# Patient Record
Sex: Female | Born: 1995 | Race: White | Hispanic: No | Marital: Single | Smoking: Former smoker
Health system: Southern US, Community
[De-identification: ages and names within clinical notes are randomized; demographics above are authoritative.]

---

## 2017-07-07 ENCOUNTER — Encounter: Payer: Self-pay | Admitting: Emergency Medicine

## 2017-07-07 ENCOUNTER — Emergency Department
Admission: EM | Admit: 2017-07-07 | Discharge: 2017-07-07 | Disposition: A | Discharge status: Home / Self Care | Payer: Self-pay | Attending: Emergency Medicine | Admitting: Emergency Medicine

## 2017-07-07 ENCOUNTER — Emergency Department: Payer: Self-pay

## 2017-07-07 DIAGNOSIS — N61 Mastitis without abscess: Secondary | ICD-10-CM | POA: Insufficient documentation

## 2017-07-07 DIAGNOSIS — R52 Pain, unspecified: Secondary | ICD-10-CM

## 2017-07-07 DIAGNOSIS — N644 Mastodynia: Secondary | ICD-10-CM | POA: Insufficient documentation

## 2017-07-07 DIAGNOSIS — Z87891 Personal history of nicotine dependence: Secondary | ICD-10-CM | POA: Insufficient documentation

## 2017-07-07 LAB — BASIC METABOLIC PANEL
Anion gap: 9 (ref 5–15)
BUN: 12 mg/dL (ref 6–20)
CHLORIDE: 104 mmol/L (ref 101–111)
CO2: 23 mmol/L (ref 22–32)
Calcium: 8.8 mg/dL — ABNORMAL LOW (ref 8.9–10.3)
Creatinine, Ser: 0.79 mg/dL (ref 0.44–1.00)
GFR calc non Af Amer: >60 mL/min (ref >60)
Glucose, Bld: 90 mg/dL (ref 65–99)
POTASSIUM: 3.5 mmol/L (ref 3.5–5.1)
SODIUM: 136 mmol/L (ref 135–145)

## 2017-07-07 LAB — CBC
HEMATOCRIT: 36.8 % (ref 35.0–47.0)
HEMOGLOBIN: 12.3 g/dL (ref 12.0–16.0)
MCH: 26 pg (ref 26.0–34.0)
MCHC: 33.3 g/dL (ref 32.0–36.0)
MCV: 78 fL — AB (ref 80.0–100.0)
Platelets: 188 10*3/uL (ref 150–440)
RBC: 4.71 MIL/uL (ref 3.80–5.20)
RDW: 16.5 % — ABNORMAL HIGH (ref 11.5–14.5)
WBC: 9.7 10*3/uL (ref 3.6–11.0)

## 2017-07-07 LAB — PROTIME-INR
INR: 1.03
Prothrombin Time: 13.5 seconds (ref 11.4–15.2)

## 2017-07-07 LAB — HEPATIC FUNCTION PANEL
ALBUMIN: 3.7 g/dL (ref 3.5–5.0)
ALT: 10 U/L — ABNORMAL LOW (ref 14–54)
AST: 21 U/L (ref 15–41)
Alkaline Phosphatase: 86 U/L (ref 38–126)
Bilirubin, Direct: 0.2 mg/dL (ref 0.1–0.5)
Indirect Bilirubin: 1.3 mg/dL — ABNORMAL HIGH (ref 0.3–0.9)
Total Bilirubin: 1.5 mg/dL — ABNORMAL HIGH (ref 0.3–1.2)
Total Protein: 7.3 g/dL (ref 6.5–8.1)

## 2017-07-07 LAB — LACTIC ACID, PLASMA: Lactic Acid, Venous: 1.3 mmol/L (ref 0.5–1.9)

## 2017-07-07 LAB — HCG, QUANTITATIVE, PREGNANCY: HCG, BETA CHAIN, QUANT, S: 2 m[IU]/mL (ref <5)

## 2017-07-07 LAB — PROCALCITONIN

## 2017-07-07 MED ORDER — SULFAMETHOXAZOLE-TRIMETHOPRIM 800-160 MG PO TABS: 1.0000 | ORAL_TABLET | Freq: Two times a day (BID) | ORAL | 0 refills | Status: AC

## 2017-07-07 MED ORDER — CEPHALEXIN 500 MG PO CAPS: 500.0000 mg | ORAL_CAPSULE | Freq: Four times a day (QID) | ORAL | 0 refills | Status: AC

## 2017-07-07 MED ORDER — CEPHALEXIN 500 MG PO CAPS
500.0000 mg | ORAL_CAPSULE | Freq: Once | ORAL | Status: AC
  Administered 2017-07-07: 500 mg via ORAL
  Filled 2017-07-07: qty 1

## 2017-07-07 MED ORDER — SULFAMETHOXAZOLE-TRIMETHOPRIM 800-160 MG PO TABS
1.0000 | ORAL_TABLET | Freq: Once | ORAL | Status: AC
  Administered 2017-07-07: 1 via ORAL
  Filled 2017-07-07: qty 1

## 2017-07-07 NOTE — ED Provider Notes (Signed)
[facility] Emergency Department Provider Note  ____________________________________________   First MD Initiated Contact with Patient 07/07/17 1107     (approximate)  I have reviewed the triage vital signs and the nursing notes.   HISTORY  Chief Complaint Abscess and Dizziness   HPI Sara Jenkins is a 21 y.o. female ho comes to the emergency Department with roughly 1 month of painful swelling on the medial aspect of her left breast. She last gave birth roughly 6 weeks ago but is never breast fed this child. She last had sex 2 months ago and she is not currently taking any birth control. She said the pain is been progressive and most notably worse this morning which prompted the visit. She denies fevers or chills. The pain is worse when pressing in improved when not pressing. She has noted no discharge from her breasts.The pain is nonradiating. She has not lost any weight unintentionally or had any other B symptoms.   History reviewed. No pertinent past medical history.  There are no active problems to display for this patient.   History reviewed. No pertinent surgical history.  Prior to Admission medications   Medication Sig Start Date End Date Taking? Authorizing Provider  cephALEXin (KEFLEX) 500 MG capsule Take 1 capsule (500 mg total) by mouth 4 (four) times daily. 07/07/17 07/17/17  Merrily Brittle, MD  sulfamethoxazole-trimethoprim (BACTRIM DS,SEPTRA DS) 800-160 MG tablet Take 1 tablet by mouth 2 (two) times daily. 07/07/17 07/17/17  Merrily Brittle, MD    Allergies Patient has no known allergies.  History reviewed. No pertinent family history.  Social History Social History  Substance Use Topics  . Smoking status: Former Games developer  . Smokeless tobacco: Never Used  . Alcohol use Yes     Comment: Occ    Review of Systems Constitutional: No fever/chills Eyes: No visual changes. ENT: No sore throat. Cardiovascular: Denies chest  pain. Respiratory: Denies shortness of breath. Gastrointestinal: No abdominal pain.  No nausea, no vomiting.  No diarrhea.  No constipation. Genitourinary: Negative for dysuria. Musculoskeletal: Negative for back pain. Skin: Negative for rash. Neurological: Negative for headaches, focal weakness or numbness.   ____________________________________________   PHYSICAL EXAM:  VITAL SIGNS: ED Triage Vitals [07/07/17 1055]  Enc Vitals Group     BP 128/83     Pulse Rate (!) 112     Resp 18     Temp 99.7 F (37.6 C)     Temp Source Oral     SpO2 97 %     Weight 220 lb (99.8 kg)     Height 5\' 2"  (1.575 m)     Head Circumference      Peak Flow      Pain Score      Pain Loc      Pain Edu?      Excl. in GC?     Constitutional: alert and oriented 4 joking and laughing very pleasant cooperative speaks in full clear sentences Eyes: PERRL EOMI. Head: Atraumatic. Nose: No congestion/rhinnorhea. Mouth/Throat: No trismus Neck: No stridor.   Cardiovascular: tachycardicrate, regular rhythm. Grossly normal heart sounds.  Good peripheral circulation. Respiratory: Normal respiratory effort.  No retractions. Lungs CTAB and moving good air Gastrointestinal: oft nontender Breast exam: chaperoned by female nurse Sonja: left breast with roughly 6 cm area of erythema warmth and tenderness and underlying induration no fluctuance appreciated. No retractions of the skin. Breasts hang equally Neurologic:  Normal speech and language. No gross focal neurologic deficits are appreciated.  Skin:  Skin is warm, dry and intact. No rash noted. Psychiatric: Mood and affect are normal. Speech and behavior are normal.    ____________________________________________   DIFFERENTIAL includes but not limited to  reast abscess, breast cellulitis, breast cancer ____________________________________________   LABS (all labs ordered are listed, but only abnormal results are displayed)  Labs Reviewed  BLOOD  CULTURE ID PANEL (REFLEXED) - Abnormal; Notable for the following:       Result Value   Staphylococcus species DETECTED (*)    All other components within normal limits  BASIC METABOLIC PANEL - Abnormal; Notable for the following:    Calcium 8.8 (*)    All other components within normal limits  CBC - Abnormal; Notable for the following:    MCV 78.0 (*)    RDW 16.5 (*)    All other components within normal limits  HEPATIC FUNCTION PANEL - Abnormal; Notable for the following:    ALT 10 (*)    Total Bilirubin 1.5 (*)    Indirect Bilirubin 1.3 (*)    All other components within normal limits  CULTURE, BLOOD (ROUTINE X 2)  CULTURE, BLOOD (ROUTINE X 2)  LACTIC ACID, PLASMA  PROCALCITONIN  HCG, QUANTITATIVE, PREGNANCY  PROTIME-INR    Blood work unremarkable  EKG  ED ECG REPORT I, Merrily BrittleNeil Aydenn Gervin, the attending physician, personally viewed and interpreted this ECG.  Date: 07/07/2017 Rate: 112 Rhythm: normal sinus rhythm QRS Axis: normal Intervals: normal ST/T Wave abnormalities: normal Narrative Interpretation: unremarkable  ____________________________________________  RADIOLOGY  Ultrasound shows simple cellulitis no evidence of abscess ____________________________________________   PROCEDURES  Procedure(s) performed: no  Procedures  Critical Care performed: o  Observation: no ____________________________________________   INITIAL IMPRESSION / ASSESSMENT AND PLAN / ED COURSE  Pertinent labs & imaging results that were available during my care of the patient were reviewed by me and considered in my medical decision making (see chart for details).  The patient is afebrile butachycardic and uncomfortable appearing. The medial aspect of her left breast is erythematous and warm concerning either for breast abscess, mastitis, versus breast cancer. She is currently not septic and does not require antibiotics untilI have better diagnosis. Labs and ultrasound are  pending.     Fortunately the patient's ultrasound is negative for abscess and it appears to be a simple cellulitis/mastitis. She is stable for outpatient management with oral antibiotics and primary care follow-up. After the patient was discharged home her blood culture came back one bottle for coag negative staph which likely represents skin contamination as the patient was not septic and did not appear bacteremic. ____________________________________________   FINAL CLINICAL IMPRESSION(S) / ED DIAGNOSES  Final diagnoses:  Pain  Mastitis      NEW MEDICATIONS STARTED DURING THIS VISIT:  Discharge Medication List as of 07/07/2017  1:42 PM    START taking these medications   Details  cephALEXin (KEFLEX) 500 MG capsule Take 1 capsule (500 mg total) by mouth 4 (four) times daily., Starting Sat 07/07/2017, Until Tue 07/17/2017, Print    sulfamethoxazole-trimethoprim (BACTRIM DS,SEPTRA DS) 800-160 MG tablet Take 1 tablet by mouth 2 (two) times daily., Starting Sat 07/07/2017, Until Tue 07/17/2017, Print         Note:  This document was prepared using Dragon voice recognition software and may include unintentional dictation errors.     Merrily Brittleifenbark, Corda Shutt, MD 07/08/17 1425

## 2017-07-07 NOTE — Discharge Instructions (Signed)
Please take all of your antibiotics as prescribed and follow up with primary care in 7-10 days for symptoms have not completely resolved. Return to the emergency department sooner for any concerns.  It was a pleasure to take care of you today, and thank you for coming to our emergency department.  If you have any questions or concerns before leaving please ask the nurse to grab me and I'm more than happy to go through your aftercare instructions again.  If you were prescribed any opioid pain medication today such as Norco, Vicodin, Percocet, morphine, hydrocodone, or oxycodone please make sure you do not drive when you are taking this medication as it can alter your ability to drive safely.  If you have any concerns once you are home that you are not improving or are in fact getting worse before you can make it to your follow-up appointment, please do not hesitate to call 911 and come back for further evaluation.  Merrily BrittleNeil Anahli Arvanitis, MD  Results for orders placed or performed during the hospital encounter of 07/07/17  Basic metabolic panel  Result Value Ref Range   Sodium 136 135 - 145 mmol/L   Potassium 3.5 3.5 - 5.1 mmol/L   Chloride 104 101 - 111 mmol/L   CO2 23 22 - 32 mmol/L   Glucose, Bld 90 65 - 99 mg/dL   BUN 12 6 - 20 mg/dL   Creatinine, Ser 2.840.79 0.44 - 1.00 mg/dL   Calcium 8.8 (L) 8.9 - 10.3 mg/dL   GFR calc non Af Amer >60 >60 mL/min   GFR calc Af Amer >60 >60 mL/min   Anion gap 9 5 - 15  CBC  Result Value Ref Range   WBC 9.7 3.6 - 11.0 K/uL   RBC 4.71 3.80 - 5.20 MIL/uL   Hemoglobin 12.3 12.0 - 16.0 g/dL   HCT 13.236.8 44.035.0 - 10.247.0 %   MCV 78.0 (L) 80.0 - 100.0 fL   MCH 26.0 26.0 - 34.0 pg   MCHC 33.3 32.0 - 36.0 g/dL   RDW 72.516.5 (H) 36.611.5 - 44.014.5 %   Platelets 188 150 - 440 K/uL  Lactic acid, plasma  Result Value Ref Range   Lactic Acid, Venous 1.3 0.5 - 1.9 mmol/L  Procalcitonin  Result Value Ref Range   Procalcitonin <0.10 ng/mL  hCG, quantitative, pregnancy  Result Value  Ref Range   hCG, Beta Chain, Quant, S 2 <5 mIU/mL  Hepatic function panel  Result Value Ref Range   Total Protein 7.3 6.5 - 8.1 g/dL   Albumin 3.7 3.5 - 5.0 g/dL   AST 21 15 - 41 U/L   ALT 10 (L) 14 - 54 U/L   Alkaline Phosphatase 86 38 - 126 U/L   Total Bilirubin 1.5 (H) 0.3 - 1.2 mg/dL   Bilirubin, Direct 0.2 0.1 - 0.5 mg/dL   Indirect Bilirubin 1.3 (H) 0.3 - 0.9 mg/dL  Protime-INR  Result Value Ref Range   Prothrombin Time 13.5 11.4 - 15.2 seconds   INR 1.03    [place] Breast Ltd Uni Left Inc Axilla  Result Date: 07/07/2017 CLINICAL DATA:  Patient presents to the ER with 1 week of left breast erythema and underlying induration. Tender to palpation. Symptoms worsening over the past week. Evaluate for abscess. No nipple discharge. Patient is not currently breast feeding and is not pregnant. Patient not on antibiotics. Note that this exam was performed through the ER by the emergency sonographer as a radiologist was not present during the  exam. EXAM: ULTRASOUND OF THE LEFT BREAST COMPARISON:  Previous exam(s). FINDINGS: Targeted ultrasound is performed, showing no evidence of focal fluid collection to suggest abscess over the upper inner quadrant of the left breast over the area in question with attention to the 11 o'clock position. Mild diffuse edematous changes which may be due to mastitis/cellulitis. IMPRESSION: Mild edematous changes over the upper inner left breast without evidence abscess. Findings may be due to mastitis/cellulitis. RECOMMENDATION: Recommend 7-10 day course of antibiotics and follow-up diagnostic left breast ultrasound only if symptoms do not resolve. I have discussed the findings and recommendations with the patient. Results were also provided in writing at the conclusion of the visit. If applicable, a reminder letter will be sent to the patient regarding the next appointment. BI-RADS CATEGORY  2: Benign. Electronically Signed   By: Elberta Fortis M.D.   On: 07/07/2017 12:31

## 2017-07-07 NOTE — ED Triage Notes (Signed)
Pt presents to ED via POV with c/o lump to L breast x 1 week with redness. Pt states pain with palpation and size has increased over the last week. Pt also c/o weakness and light headedness x 1 week. Pt c/o some nausea, denies any vomiting or diarrhea. Pt is alert and oriented at this time.

## 2017-07-07 NOTE — ED Notes (Signed)
Patient has been unable to give urine specimen. Dr. Lamont Snowballifenbark aware.

## 2017-07-08 ENCOUNTER — Telehealth: Payer: Self-pay | Admitting: Emergency Medicine

## 2017-07-08 LAB — BLOOD CULTURE ID PANEL (REFLEXED)

## 2017-07-08 NOTE — ED Provider Notes (Signed)
Positive blood culture result brought to my attention. One bottle, aerobic, coag negative staph, likely contaminant. Patient was seen in the ED yesterday with normal vital signs on discharge, treated for mastitis. Charge nurse will contact patient to make sure she is not deteriorating, but I think that this is the likely non-issue and the Keflex and Bactrim should adequately cover this source. If The patient is worse she'll be instructed to return to ED.   Sharman CheekStafford, Adeliz Tonkinson, MD 07/08/17 1231

## 2017-07-10 LAB — CULTURE, BLOOD (ROUTINE X 2)

## 2017-07-12 LAB — CULTURE, BLOOD (ROUTINE X 2)
Culture: NO GROWTH
Special Requests: ADEQUATE

## 2017-12-05 ENCOUNTER — Other Ambulatory Visit: Payer: Self-pay

## 2017-12-05 ENCOUNTER — Encounter (HOSPITAL_COMMUNITY): Payer: Self-pay | Admitting: Emergency Medicine

## 2017-12-05 ENCOUNTER — Emergency Department (HOSPITAL_COMMUNITY)
Admission: EM | Admit: 2017-12-05 | Discharge: 2017-12-05 | Disposition: A | Discharge status: Home / Self Care | Payer: Self-pay | Attending: Emergency Medicine | Admitting: Emergency Medicine

## 2017-12-05 DIAGNOSIS — Z87891 Personal history of nicotine dependence: Secondary | ICD-10-CM | POA: Insufficient documentation

## 2017-12-05 DIAGNOSIS — R55 Syncope and collapse: Secondary | ICD-10-CM | POA: Insufficient documentation

## 2017-12-05 LAB — TSH: TSH: 0.914 u[IU]/mL (ref 0.350–4.500)

## 2017-12-05 LAB — CBC
HCT: 40.4 % (ref 36.0–46.0)
HEMOGLOBIN: 13.3 g/dL (ref 12.0–15.0)
MCH: 27 pg (ref 26.0–34.0)
MCHC: 32.9 g/dL (ref 30.0–36.0)
MCV: 82.1 fL (ref 78.0–100.0)
PLATELETS: 197 10*3/uL (ref 150–400)
RBC: 4.92 MIL/uL (ref 3.87–5.11)
RDW: 14.2 % (ref 11.5–15.5)
WBC: 6.5 10*3/uL (ref 4.0–10.5)

## 2017-12-05 LAB — BASIC METABOLIC PANEL
ANION GAP: 10 (ref 5–15)
BUN: 12 mg/dL (ref 6–20)
CALCIUM: 9.1 mg/dL (ref 8.9–10.3)
CO2: 25 mmol/L (ref 22–32)
CREATININE: 0.58 mg/dL (ref 0.44–1.00)
Chloride: 103 mmol/L (ref 101–111)
GFR calc Af Amer: >60 mL/min (ref >60)
Glucose, Bld: 98 mg/dL (ref 65–99)
Potassium: 3.9 mmol/L (ref 3.5–5.1)
SODIUM: 138 mmol/L (ref 135–145)

## 2017-12-05 LAB — CBG MONITORING, ED: Glucose-Capillary: 108 mg/dL — ABNORMAL HIGH (ref 65–99)

## 2017-12-05 LAB — URINALYSIS, ROUTINE W REFLEX MICROSCOPIC
BILIRUBIN URINE: NEGATIVE
GLUCOSE, UA: NEGATIVE mg/dL
HGB URINE DIPSTICK: NEGATIVE
KETONES UR: NEGATIVE mg/dL
NITRITE: NEGATIVE
PH: 7 (ref 5.0–8.0)
PROTEIN: NEGATIVE mg/dL
Specific Gravity, Urine: 1.015 (ref 1.005–1.030)

## 2017-12-05 LAB — I-STAT BETA HCG BLOOD, ED (MC, WL, AP ONLY): I-stat hCG, quantitative: <5 m[IU]/mL (ref <5)

## 2017-12-05 MED ORDER — SODIUM CHLORIDE 0.9 % IV BOLUS (SEPSIS)
1000.0000 mL | Freq: Once | INTRAVENOUS | Status: AC
  Administered 2017-12-05: 1000 mL via INTRAVENOUS

## 2017-12-05 NOTE — ED Notes (Signed)
Pt aware of need for urine sample.  

## 2017-12-05 NOTE — ED Notes (Signed)
Vital signs entered in error on wrong pt

## 2017-12-05 NOTE — ED Triage Notes (Signed)
Pt states was at work and fainted. Pt reports this happens 2-3 times a week. Has not seen a doctor about episodes. Has had this since 2017 during her third pregnancy. Pt recently moved from [place], does not have PCP.

## 2017-12-05 NOTE — ED Provider Notes (Signed)
[facility] EMERGENCY DEPARTMENT Provider Note   CSN: [id number] Arrival date & time: 12/05/17  0741     History   Chief Complaint Chief Complaint  Patient presents with  . Loss of Consciousness    HPI Sara Jenkins is a 22 y.o. female.  HPI  Sara Jenkins is a 22 y.o. female who presents to the Emergency Department complaining of a syncopal episode this morning while at work.  She states this is a recurring problem for her since her third pregnancy in 2017.  She states the episodes are typically brief lasting 1-2 minutes and occur 2-3 times a week.  She states [place] and has not established a PCP yet.  She has not had a medical evaluation for these symptoms.  She states that the episodes typically occur while standing but are not precipitated by any particular event.  She states that she wakes up after these events alert.  This morning she states that she came to the emergency department after this episode because her supervisor requested.  She denies any symptoms at present.  She denies history of cardiac issues, diabetes, or thyroid disorders.   History reviewed. No pertinent past medical history.  There are no active problems to display for this patient.   History reviewed. No pertinent surgical history.  OB History    Gravida Para Term Preterm AB Living   5 4           SAB TAB Ectopic Multiple Live Births                   Home Medications    Prior to Admission medications   Not on File    Family History History reviewed. No pertinent family history.  Social History Social History   Tobacco Use  . Smoking status: Former Smoker    Years: 5.00  . Smokeless tobacco: Never Used  Substance Use Topics  . Alcohol use: Yes    Comment: Occ  . Drug use: No     Allergies   Patient has no known allergies.   Review of Systems Review of Systems  Constitutional: Negative for chills, fatigue and fever.  HENT:  Negative for congestion, sore throat and trouble swallowing.   Eyes: Negative for photophobia and visual disturbance.  Respiratory: Negative for cough, shortness of breath and wheezing.   Cardiovascular: Negative for chest pain and palpitations.  Gastrointestinal: Negative for abdominal pain, blood in stool, nausea and vomiting.  Genitourinary: Negative for dysuria, flank pain and hematuria.  Musculoskeletal: Negative for arthralgias, back pain, myalgias, neck pain and neck stiffness.  Skin: Negative for rash.  Neurological: Positive for syncope. Negative for dizziness, seizures, speech difficulty, weakness, numbness and headaches.  Hematological: Does not bruise/bleed easily.  Psychiatric/Behavioral: Negative for confusion.     Physical Exam Updated Vital Signs BP 139/84 (BP Location: Right Arm)   Pulse (!) 106   Temp 98.3 F (36.8 C) (Oral)   Resp 18   Ht 5\' 2"  (1.575 m)   Wt 99.8 kg (220 lb)   LMP 10/24/2017   SpO2 100%   BMI 40.24 kg/m   Physical Exam  Constitutional: She is oriented to person, place, and time. She appears well-developed and well-nourished. No distress.  HENT:  Head: Atraumatic.  Mouth/Throat: Oropharynx is clear and moist.  Eyes: Conjunctivae and EOM are normal. Pupils are equal, round, and reactive to light.  Neck: Normal range of motion. Neck supple. No  tracheal deviation present.  Cardiovascular: Normal rate, regular rhythm, normal heart sounds and intact distal pulses.  No murmur heard. Pulmonary/Chest: Effort normal and breath sounds normal. No respiratory distress.  Abdominal: Soft. She exhibits no distension. There is no tenderness. There is no guarding.  Musculoskeletal: Normal range of motion.  Lymphadenopathy:    She has no cervical adenopathy.  Neurological: She is alert and oriented to person, place, and time. She has normal strength. No sensory deficit. She exhibits normal muscle tone. She displays no seizure activity. Gait normal. GCS eye  subscore is 4. GCS verbal subscore is 5. GCS motor subscore is 6.  CN II-XII intact.  No facial weakness, dysarthria, pronator drift.  Skin: Skin is warm. Capillary refill takes less than 2 seconds. No rash noted.  Psychiatric: She has a normal mood and affect.  Nursing note and vitals reviewed.    ED Treatments / Results  Labs (all labs ordered are listed, but only abnormal results are displayed) Labs Reviewed  URINALYSIS, ROUTINE W REFLEX MICROSCOPIC - Abnormal; Notable for the following components:      Result Value   APPearance CLOUDY (*)    Leukocytes, UA MODERATE (*)    Bacteria, UA RARE (*)    Squamous Epithelial / LPF 6-30 (*)    All other components within normal limits  CBG MONITORING, ED - Abnormal; Notable for the following components:   Glucose-Capillary 108 (*)    All other components within normal limits  URINE CULTURE  BASIC METABOLIC PANEL  CBC  TSH  I-STAT BETA HCG BLOOD, ED ([facility], [facility], [facility] ONLY)    EKG  EKG Interpretation  Date/Time:  Wednesday December 05 2017 08:03:12 EST Ventricular Rate:  98 PR Interval:    QRS Duration: 102 QT Interval:  349 QTC Calculation: 446 R Axis:   25 Text Interpretation:  Sinus rhythm Low voltage, precordial leads No significant change since last tracing Confirmed by Vanetta Mulders [phone]) on 12/05/2017 10:14:30 AM       Radiology No results found.  Procedures Procedures (including critical care time)  Medications Ordered in ED Medications  sodium chloride 0.9 % bolus 1,000 mL (0 mLs Intravenous Stopped 12/05/17 1007)     Initial Impression / Assessment and Plan / ED Course  I have reviewed the triage vital signs and the nursing notes.  Pertinent labs & imaging results that were available during my care of the patient were reviewed by me and considered in my medical decision making (see chart for details).    Orthostatic VS for the past 24 hrs (Last 3 readings):  BP- Lying Pulse- Lying BP- Sitting Pulse- Sitting  BP- Standing at 0 minutes Pulse- Standing at 0 minutes  12/05/17 0833 112/88 98 127/89 100 (!) 133/108 92   Patient is well-appearing.  Vital signs are stable.  History this is a recurring problem for her.  Labs EKG are reassuring.  Patient does not appear postictal  The patient appears reasonably screened and/or stabilized for discharge and I doubt any other medical condition or other [facility] requiring further screening, evaluation, or treatment in the ED at this time prior to discharge.  Patient given referral information to establish primary care.  Return precautions were discussed.  Final Clinical Impressions(s) / ED Diagnoses   Final diagnoses:  Syncope, unspecified syncope type    ED Discharge Orders    None       Pauline Aus, PA-C 12/05/17 2034    Vanetta Mulders, MD 12/06/17 1606

## 2017-12-05 NOTE — Discharge Instructions (Signed)
Contact one of the providers listed to establish primary care.  Return to ER for any worsening symptoms

## 2017-12-07 LAB — URINE CULTURE

## 2018-01-14 ENCOUNTER — Other Ambulatory Visit: Payer: Self-pay

## 2018-01-14 ENCOUNTER — Encounter (HOSPITAL_COMMUNITY): Payer: Self-pay | Admitting: Emergency Medicine

## 2018-01-14 ENCOUNTER — Emergency Department (HOSPITAL_COMMUNITY)
Admission: EM | Admit: 2018-01-14 | Discharge: 2018-01-14 | Disposition: A | Discharge status: Home / Self Care | Payer: Self-pay | Attending: Emergency Medicine | Admitting: Emergency Medicine

## 2018-01-14 DIAGNOSIS — R519 Headache, unspecified: Secondary | ICD-10-CM

## 2018-01-14 DIAGNOSIS — R51 Headache: Secondary | ICD-10-CM | POA: Insufficient documentation

## 2018-01-14 DIAGNOSIS — Z87891 Personal history of nicotine dependence: Secondary | ICD-10-CM | POA: Insufficient documentation

## 2018-01-14 LAB — POC URINE PREG, ED: Preg Test, Ur: NEGATIVE

## 2018-01-14 MED ORDER — DIPHENHYDRAMINE HCL 50 MG/ML IJ SOLN
12.5000 mg | Freq: Once | INTRAMUSCULAR | Status: AC
  Administered 2018-01-14: 12.5 mg via INTRAVENOUS
  Filled 2018-01-14: qty 1

## 2018-01-14 MED ORDER — GUAIFENESIN-CODEINE 100-10 MG/5ML PO SYRP: 10.0000 mL | ORAL_SOLUTION | Freq: Three times a day (TID) | ORAL | 0 refills | Status: DC | PRN

## 2018-01-14 MED ORDER — METOCLOPRAMIDE HCL 5 MG/ML IJ SOLN
10.0000 mg | Freq: Once | INTRAMUSCULAR | Status: AC
  Administered 2018-01-14: 10 mg via INTRAVENOUS
  Filled 2018-01-14: qty 2

## 2018-01-14 MED ORDER — KETOROLAC TROMETHAMINE 30 MG/ML IJ SOLN
30.0000 mg | Freq: Once | INTRAMUSCULAR | Status: AC
  Administered 2018-01-14: 30 mg via INTRAVENOUS
  Filled 2018-01-14: qty 1

## 2018-01-14 NOTE — ED Notes (Signed)
Pt constant HA since last night, emesis x2-3 early morning, pt with blurry vision, pt admits to using reading glasses. Pt denies hx of migraines.

## 2018-01-14 NOTE — ED Provider Notes (Signed)
[facility] EMERGENCY DEPARTMENT Provider Note   CSN: [id number] Arrival date & time: 01/14/18  2008     History   Chief Complaint Chief Complaint  Patient presents with  . Headache    HPI Sara Jenkins is a 22 y.o. female.  HPI   Sara Jenkins is a 22 y.o. female who presents to the Emergency Department complaining of temporal headache of gradual onset for one day.  Headache began as throbbing sensation to both temples and associated with excessive coughing, runny nose and nasal congestion.  Today, headache pain is making her nauseous and she has had intermittent "flashes" in her vision.  Headache is worse with coughing.  She has been taking OTC cold and cough medicine without relief.  She denies neck pain or stiffness, fever, abdominal pain and persistent vomiting.   History reviewed. No pertinent past medical history.  There are no active problems to display for this patient.   History reviewed. No pertinent surgical history.  OB History    Gravida Para Term Preterm AB Living   5 4           SAB TAB Ectopic Multiple Live Births                   Home Medications    Prior to Admission medications   Medication Sig Start Date End Date Taking? Authorizing Provider  sertraline (ZOLOFT) 25 MG tablet Take 25 mg by mouth daily.    [provider]    Family History History reviewed. No pertinent family history.  Social History Social History   Tobacco Use  . Smoking status: Former Smoker    Years: 5.00  . Smokeless tobacco: Never Used  Substance Use Topics  . Alcohol use: Yes    Comment: Occ  . Drug use: No     Allergies   Patient has no known allergies.   Review of Systems Review of Systems  Constitutional: Negative for activity change, appetite change and fever.  HENT: Positive for congestion, rhinorrhea and sinus pressure. Negative for facial swelling and trouble swallowing.   Eyes: Positive for photophobia and visual disturbance. Negative for  pain.  Respiratory: Positive for cough. Negative for chest tightness and shortness of breath.   Cardiovascular: Negative for chest pain.  Gastrointestinal: Positive for nausea. Negative for abdominal pain and vomiting.  Genitourinary: Negative for dysuria, flank pain and frequency.  Musculoskeletal: Negative for neck pain and neck stiffness.  Skin: Negative for rash and wound.  Neurological: Positive for headaches. Negative for dizziness, facial asymmetry, speech difficulty, weakness and numbness.  Psychiatric/Behavioral: Negative for confusion and decreased concentration.  All other systems reviewed and are negative.    Physical Exam Updated Vital Signs BP 138/85 (BP Location: Right Arm)   Pulse 96   Temp 98.4 F (36.9 C) (Oral)   Resp 17   Ht 5\' 2"  (1.575 m)   Wt 99.8 kg (220 lb)   LMP 01/13/2018   SpO2 98%   Breastfeeding? Unknown   BMI 40.24 kg/m   Physical Exam  Constitutional: She is oriented to person, place, and time. She appears well-developed and well-nourished. No distress.  HENT:  Head: Normocephalic and atraumatic.  Mouth/Throat: Oropharynx is clear and moist.  Eyes: Conjunctivae and EOM are normal. Pupils are equal, round, and reactive to light.  Neck: Normal range of motion and phonation normal. Neck supple. No spinous process tenderness and no muscular tenderness present. No neck rigidity. No Kernig's sign noted.  Cardiovascular: Normal rate,  regular rhythm and intact distal pulses.  No murmur heard. Pulmonary/Chest: Effort normal and breath sounds normal. No respiratory distress.  Abdominal: Soft. She exhibits no distension. There is no tenderness. There is no guarding.  Musculoskeletal: Normal range of motion.  Neurological: She is alert and oriented to person, place, and time. She has normal strength. No cranial nerve deficit or sensory deficit. She exhibits normal muscle tone. Coordination and gait normal. GCS eye subscore is 4. GCS verbal subscore is 5.  GCS motor subscore is 6.  Reflex Scores:      Tricep reflexes are 2+ on the right side and 2+ on the left side.      Bicep reflexes are 2+ on the right side and 2+ on the left side. CN III-XII grossly intact  Skin: Skin is warm and dry. Capillary refill takes less than 2 seconds. No rash noted.  Psychiatric: She has a normal mood and affect. Thought content normal.  Nursing note and vitals reviewed.    ED Treatments / Results  Labs (all labs ordered are listed, but only abnormal results are displayed) Labs Reviewed  POC URINE PREG, ED    EKG  EKG Interpretation None       Radiology No results found.  Procedures Procedures (including critical care time)  Medications Ordered in ED Medications  ketorolac (TORADOL) 30 MG/ML injection 30 mg (not administered)  diphenhydrAMINE (BENADRYL) injection 12.5 mg (not administered)  metoCLOPramide (REGLAN) injection 10 mg (not administered)     Initial Impression / Assessment and Plan / ED Course  I have reviewed the triage vital signs and the nursing notes.  Pertinent labs & imaging results that were available during my care of the patient were reviewed by me and considered in my medical decision making (see chart for details).      On recheck, pt is feeling better,  Reports headache has resolved and she is requesting d/c home.  sx's felt to be related to sinus headache. No nuchal rigidity or neuro deficits.  Pt agrees to tx plan with antitussive and decongestant.      Final Clinical Impressions(s) / ED Diagnoses   Final diagnoses:  Sinus headache    ED Discharge Orders    None       Pauline Aus, PA-C 01/14/18 2304    Donnetta Hutching, MD 01/15/18 1558

## 2018-01-14 NOTE — ED Triage Notes (Signed)
Pt c/o headache that is making her nauseous and blurred vision at times. Pt states she normally does not have headaches.

## 2018-01-14 NOTE — ED Notes (Signed)
Pt noted with runny nose and cough as well.  Symptoms ongoing for 2-3 weeks per pt.

## 2018-01-14 NOTE — Discharge Instructions (Signed)
You may take Tylenol or ibuprofen if needed for fever or body aches.  Stop the over-the-counter cold and cough medications.  You may try taking Sudafed as directed.  Follow-up with your primary doctor or return here for any worsening symptoms.

## 2018-01-26 ENCOUNTER — Other Ambulatory Visit: Payer: Self-pay

## 2018-01-26 ENCOUNTER — Ambulatory Visit (HOSPITAL_COMMUNITY)
Admission: EM | Admit: 2018-01-26 | Discharge: 2018-01-26 | Disposition: A | Discharge status: Home / Self Care | Payer: Medicaid Other | Attending: Family Medicine | Admitting: Family Medicine

## 2018-01-26 ENCOUNTER — Encounter (HOSPITAL_COMMUNITY): Payer: Self-pay

## 2018-01-26 DIAGNOSIS — H66001 Acute suppurative otitis media without spontaneous rupture of ear drum, right ear: Secondary | ICD-10-CM

## 2018-01-26 DIAGNOSIS — H60391 Other infective otitis externa, right ear: Secondary | ICD-10-CM

## 2018-01-26 MED ORDER — NEOMYCIN-POLYMYXIN-HC 3.5-10000-1 OT SUSP: 4.0000 [drp] | Freq: Three times a day (TID) | OTIC | 0 refills | Status: DC

## 2018-01-26 MED ORDER — AMOXICILLIN 875 MG PO TABS: 875.0000 mg | ORAL_TABLET | Freq: Two times a day (BID) | ORAL | 0 refills | Status: AC

## 2018-01-26 NOTE — ED Provider Notes (Signed)
  [facility]   [id number] 01/26/18 Arrival Time: 1647  ASSESSMENT & PLAN:  1. Acute suppurative otitis media of right ear without spontaneous rupture of tympanic membrane, recurrence not specified   2. Other infective acute otitis externa of right ear     Meds ordered this encounter  Medications  . amoxicillin (AMOXIL) 875 MG tablet    Sig: Take 1 tablet (875 mg total) by mouth 2 (two) times daily for 10 days.    Dispense:  20 tablet    Refill:  0  . neomycin-polymyxin-hydrocortisone (CORTISPORIN) 3.5-10000-1 OTIC suspension    Sig: Place 4 drops into the right ear 3 (three) times daily.    Dispense:  10 mL    Refill:  0   Discussed typical duration of symptoms. OTC symptom care/analgesics as needed. May f/u with PCP or here if not seeing improvement over the next few days.  Reviewed expectations re: course of current medical issues. Questions answered. Outlined signs and symptoms indicating need for more acute intervention. Patient verbalized understanding. After Visit Summary given.   SUBJECTIVE: History from: patient.  Sara Jenkins is a 22 y.o. female who presents with complaint of right otalgia without drainage though she reports seeing a few drops of blood on her pillow today after a nap. Noticed ear discomfort this morning. Slightly decreased hearing in right ear. Recent cold symptoms: mild congestion. Fever: no. Overall normal PO intake without n/v. Sick contacts: no. OTC treatment: none.  Social History   Tobacco Use  Smoking Status Former Smoker  . Years: 5.00  Smokeless Tobacco Never Used    ROS: As per HPI.   OBJECTIVE:  Vitals:   01/26/18 1708  BP: (!) 141/82  Pulse: (!) 118  Resp: 18  Temp: 98.9 F (37.2 C)  TempSrc: Oral  SpO2: 96%     General appearance: alert; appears fatigued Ear Canal: edema and inflammation on the right TM: right: erythematous, bulging Neck: supple without LAD Lungs: unlabored respirations, symmetrical air  entry; cough: absent; no respiratory distress Skin: warm and dry Psychological: alert and cooperative; normal mood and affect  Allergies  Allergen Reactions  . Peanut Oil Anaphylaxis     Social History   Socioeconomic History  . Marital status: Single    Spouse name: Not on file  . Number of children: Not on file  . Years of education: Not on file  . Highest education level: Not on file  Social Needs  . Financial resource strain: Not on file  . Food insecurity - worry: Not on file  . Food insecurity - inability: Not on file  . Transportation needs - medical: Not on file  . Transportation needs - non-medical: Not on file  Occupational History  . Not on file  Tobacco Use  . Smoking status: Former Smoker    Years: 5.00  . Smokeless tobacco: Never Used  Substance and Sexual Activity  . Alcohol use: Yes    Comment: Occ  . Drug use: No  . Sexual activity: Yes    Birth control/protection: None  Other Topics Concern  . Not on file  Social History Narrative  . Not on file            [name], [name], MD 01/26/18 1807

## 2018-01-26 NOTE — ED Triage Notes (Signed)
Pt presents today with right ear pain that started today. States she took a nap and when she woke up she noticed blood on her pillow and her ear was hurting a lot.

## 2018-04-25 ENCOUNTER — Other Ambulatory Visit: Payer: Self-pay

## 2018-04-25 ENCOUNTER — Encounter (HOSPITAL_COMMUNITY): Payer: Self-pay | Admitting: *Deleted

## 2018-04-25 ENCOUNTER — Emergency Department (HOSPITAL_COMMUNITY)
Admission: EM | Admit: 2018-04-25 | Discharge: 2018-04-25 | Disposition: A | Discharge status: Home / Self Care | Payer: Medicaid Other | Attending: Emergency Medicine | Admitting: Emergency Medicine

## 2018-04-25 DIAGNOSIS — L089 Local infection of the skin and subcutaneous tissue, unspecified: Secondary | ICD-10-CM | POA: Insufficient documentation

## 2018-04-25 DIAGNOSIS — Z87891 Personal history of nicotine dependence: Secondary | ICD-10-CM | POA: Insufficient documentation

## 2018-04-25 DIAGNOSIS — M25561 Pain in right knee: Secondary | ICD-10-CM | POA: Insufficient documentation

## 2018-04-25 DIAGNOSIS — Z79899 Other long term (current) drug therapy: Secondary | ICD-10-CM | POA: Insufficient documentation

## 2018-04-25 DIAGNOSIS — Z91010 Allergy to peanuts: Secondary | ICD-10-CM | POA: Insufficient documentation

## 2018-04-25 MED ORDER — TRAMADOL HCL 50 MG PO TABS: ORAL_TABLET | ORAL | 0 refills | Status: DC

## 2018-04-25 MED ORDER — KETOROLAC TROMETHAMINE 10 MG PO TABS
10.0000 mg | ORAL_TABLET | Freq: Once | ORAL | Status: AC
  Administered 2018-04-25: 10 mg via ORAL
  Filled 2018-04-25: qty 1

## 2018-04-25 MED ORDER — DOXYCYCLINE HYCLATE 100 MG PO TABS
100.0000 mg | ORAL_TABLET | Freq: Once | ORAL | Status: AC
  Administered 2018-04-25: 100 mg via ORAL
  Filled 2018-04-25: qty 1

## 2018-04-25 MED ORDER — DICLOFENAC SODIUM 75 MG PO TBEC: 75.0000 mg | DELAYED_RELEASE_TABLET | Freq: Two times a day (BID) | ORAL | 0 refills | Status: DC

## 2018-04-25 MED ORDER — DOXYCYCLINE HYCLATE 100 MG PO CAPS: 100.0000 mg | ORAL_CAPSULE | Freq: Two times a day (BID) | ORAL | 0 refills | Status: DC

## 2018-04-25 NOTE — ED Provider Notes (Signed)
[facility] EMERGENCY DEPARTMENT Provider Note   CSN: [id number] Arrival date & time: 04/25/18  1205     History   Chief Complaint Chief Complaint  Patient presents with  . Abscess    HPI Sara Jenkins is a 22 y.o. female.  Patient noticed a small red raised area on the side of her right knee.  She is not sure if she sustained a minor injury or if something bit her.  She does not recall either event happening.  She is never had this to happen to her knee before.  The knee is getting more more sore with attempted range of motion and with walking.  The history is provided by the patient.  Knee Pain   This is a new problem. The current episode started more than 2 days ago. The pain is present in the left knee. The pain is moderate. Pertinent negatives include no numbness. Associated symptoms comments: Pain with ROM.Marland Kitchen She has tried nothing for the symptoms. The treatment provided no relief. There has been no history of extremity trauma. Family history is significant for no rheumatoid arthritis.    History reviewed. No pertinent past medical history.  There are no active problems to display for this patient.   History reviewed. No pertinent surgical history.   OB History    Gravida  5   Para  4   Term      Preterm      AB      Living        SAB      TAB      Ectopic      Multiple      Live Births               Home Medications    Prior to Admission medications   Medication Sig Start Date End Date Taking? Authorizing Provider  therapeutic multivitamin-minerals [facility]) tablet Take 1 tablet by mouth daily.   Yes [provider]  acetaminophen (TYLENOL) 500 MG tablet Take 500 mg by mouth every 6 (six) hours as needed for mild pain or moderate pain.    [provider]  neomycin-polymyxin-hydrocortisone (CORTISPORIN) 3.5-10000-1 OTIC suspension Place 4 drops into the right ear 3 (three) times daily. 01/26/18   Mardella Layman, MD     Family History History reviewed. No pertinent family history.  Social History Social History   Tobacco Use  . Smoking status: Former Smoker    Years: 5.00  . Smokeless tobacco: Never Used  Substance Use Topics  . Alcohol use: Yes    Comment: Occ  . Drug use: No     Allergies   Peanut oil   Review of Systems Review of Systems  Constitutional: Negative for activity change.       All ROS Neg except as noted in HPI  HENT: Negative for nosebleeds.   Eyes: Negative for photophobia and discharge.  Respiratory: Negative for cough, shortness of breath and wheezing.   Cardiovascular: Negative for chest pain and palpitations.  Gastrointestinal: Negative for abdominal pain and blood in stool.  Genitourinary: Negative for dysuria, frequency and hematuria.  Musculoskeletal: Positive for arthralgias. Negative for back pain and neck pain.       Knee pain  Skin: Negative.   Neurological: Negative for dizziness, seizures, speech difficulty and numbness.  Psychiatric/Behavioral: Negative for confusion and hallucinations.     Physical Exam Updated Vital Signs BP (!) 141/100 (BP Location: Right Arm)   Pulse (!) 103  Temp 98.1 F (36.7 C) (Oral)   Resp 20   Ht  (1.6 m)   Wt 104.3 kg (230 lb)   LMP 04/24/2018   SpO2 99%   BMI 40.74 kg/m   Physical Exam  Constitutional: She is oriented to person, place, and time. She appears well-developed and well-nourished.  Non-toxic appearance.  HENT:  Head: Normocephalic.  Right Ear: Tympanic membrane and external ear normal.  Left Ear: Tympanic membrane and external ear normal.  Eyes: Pupils are equal, round, and reactive to light. EOM and lids are normal.  Neck: Normal range of motion. Neck supple. Carotid bruit is not present.  Cardiovascular: Normal rate, regular rhythm, normal heart sounds, intact distal pulses and normal pulses.  Pulmonary/Chest: Breath sounds normal. No respiratory distress.  Abdominal: Soft. Bowel  sounds are normal. There is no tenderness. There is no guarding.  Musculoskeletal: Normal range of motion.       Right knee: Tenderness found. Lateral joint line tenderness noted.  There is a nickel to quarter size red raised area of the lateral aspect of the right knee.  There are no red streaks appreciated.  The area is warm, but not hot.  The anterior tibial tuberosity is in place.  There is no deformity of the quadricep area.  The patella is in the midline.  The dorsalis pedis pulses 2+.  Lymphadenopathy:       Head (right side): No submandibular adenopathy present.       Head (left side): No submandibular adenopathy present.    She has no cervical adenopathy.  Neurological: She is alert and oriented to person, place, and time. She has normal strength. No cranial nerve deficit or sensory deficit.  Skin: Skin is warm and dry.  Psychiatric: She has a normal mood and affect. Her speech is normal.  Nursing note and vitals reviewed.    ED Treatments / Results  Labs (all labs ordered are listed, but only abnormal results are displayed) Labs Reviewed - No data to display  EKG None  Radiology No results found.  Procedures Procedures (including critical care time)  Medications Ordered in ED Medications - No data to display   Initial Impression / Assessment and Plan / ED Course  I have reviewed the triage vital signs and the nursing notes.  Pertinent labs & imaging results that were available during my care of the patient were reviewed by me and considered in my medical decision making (see chart for details).       Final Clinical Impressions(s) / ED Diagnoses MDM  Vital signs reviewed.  There is a small puncture looking area on the side of the right knee near the red raised area.  Question insect bite versus puncture from unknown source.  Patient will be covered with antibiotics as well as anti-inflammatory medications.  Patient will use a knee sleeve over the next 7 to 10  days.  Patient will follow-up with orthopedics if not improving.  Patient is in agreement with this plan.   Final diagnoses:  Skin infection  Acute pain of right knee    ED Discharge Orders        Ordered    doxycycline (VIBRAMYCIN) 100 MG capsule  2 times daily     04/25/18 1457    diclofenac (VOLTAREN) 75 MG EC tablet  2 times daily     04/25/18 1457    traMADol (ULTRAM) 50 MG tablet     04/25/18 1457       Beverely Pace,  Link Snuffer, PA-C 04/26/18 1324    Bethann Berkshire, MD 04/26/18 [phone]

## 2018-04-25 NOTE — ED Triage Notes (Signed)
Pt c/o bump to her left knee x 2 days; pt states she has used hot and cold compresses with ibuprofen with no relief

## 2018-04-25 NOTE — Discharge Instructions (Addendum)
Your examination suggest a infection of the side of your knee, possibly from a break in the skin.  This is probably complicating the chronic ongoing pain in your knee related to standing and walking.  Please use the knee sleeve over the next 7 to 10 days.  Please use diclofenac 2 times daily with food.  Use doxycycline 2 times daily with food for prevention of infection.  May use Ultram for more severe pain. This medication may cause drowsiness. Please do not drink, drive, or participate in activity that requires concentration while taking this medication.

## 2018-06-27 HISTORY — PX: ADENOIDECTOMY: SUR15

## 2018-06-28 NOTE — Telephone Encounter (Signed)
Completed.

## 2018-10-22 ENCOUNTER — Encounter (HOSPITAL_COMMUNITY): Payer: Self-pay | Admitting: Emergency Medicine

## 2018-10-22 ENCOUNTER — Emergency Department (HOSPITAL_COMMUNITY): Payer: Self-pay

## 2018-10-22 ENCOUNTER — Emergency Department (HOSPITAL_COMMUNITY)
Admission: EM | Admit: 2018-10-22 | Discharge: 2018-10-23 | Disposition: A | Discharge status: Home / Self Care | Payer: Self-pay | Attending: Emergency Medicine | Admitting: Emergency Medicine

## 2018-10-22 ENCOUNTER — Other Ambulatory Visit: Payer: Self-pay

## 2018-10-22 DIAGNOSIS — M79672 Pain in left foot: Secondary | ICD-10-CM | POA: Insufficient documentation

## 2018-10-22 DIAGNOSIS — Z87891 Personal history of nicotine dependence: Secondary | ICD-10-CM | POA: Insufficient documentation

## 2018-10-22 DIAGNOSIS — Z91010 Allergy to peanuts: Secondary | ICD-10-CM | POA: Insufficient documentation

## 2018-10-22 DIAGNOSIS — Z79899 Other long term (current) drug therapy: Secondary | ICD-10-CM | POA: Insufficient documentation

## 2018-10-22 MED ORDER — NAPROXEN 500 MG PO TABS: 500.0000 mg | ORAL_TABLET | Freq: Two times a day (BID) | ORAL | 0 refills | Status: DC

## 2018-10-22 MED ORDER — IBUPROFEN 400 MG PO TABS
400.0000 mg | ORAL_TABLET | Freq: Once | ORAL | Status: AC
  Administered 2018-10-22: 400 mg via ORAL
  Filled 2018-10-22: qty 1

## 2018-10-22 NOTE — ED Triage Notes (Signed)
Pt c/o left foot pains x several months with increased pain over the past week, pt feels she may have bone spur

## 2018-10-22 NOTE — ED Notes (Signed)
Patient transported to X-ray 

## 2018-10-22 NOTE — ED Notes (Signed)
Patient returned from xray.

## 2018-10-22 NOTE — Discharge Instructions (Addendum)
Your x-ray is negative.  Wear hard soled shoes.  Keep your foot elevated and use ice when you are resting at home.  Follow-up with your primary doctor.  Return to the ED if you develop new or worsening symptoms.

## 2018-10-22 NOTE — ED Provider Notes (Signed)
[facility] EMERGENCY DEPARTMENT Provider Note   CSN: [id number] Arrival date & time: 10/22/18  2240     History   Chief Complaint Chief Complaint  Patient presents with  . Foot Pain    HPI Sara Jenkins is a 22 y.o. female.  Patient reports pain into her left dorsal foot ongoing for several weeks but becoming progressively worse over this past week.  Denies any falls or trauma.  She works as a Lawyer, and wears crocs.  She noticed a red area to the dorsum of her foot tonight and became concerned.  She is been taking ibuprofen at home with partial relief.  Last dose was 24 hours ago.  She denies any other joint pain.  No fever, chills, nausea or vomiting.  No rash.  The pain is in the top of her foot and is worse with palpation and movement.  She denies any heel pain.  She is concerned that she could have a bone spur.  Denies any other joint pain.  Denies any other medical problems.  The history is provided by the patient.  Foot Pain  Pertinent negatives include no chest pain, no abdominal pain, no headaches and no shortness of breath.    History reviewed. No pertinent past medical history.  There are no active problems to display for this patient.   Past Surgical History:  Procedure Laterality Date  . ADENOIDECTOMY  06/2018     OB History    Gravida  5   Para  4   Term      Preterm      AB      Living        SAB      TAB      Ectopic      Multiple      Live Births               Home Medications    Prior to Admission medications   Medication Sig Start Date End Date Taking? Authorizing Provider  acetaminophen (TYLENOL) 500 MG tablet Take 500 mg by mouth every 6 (six) hours as needed for mild pain or moderate pain.   Yes [provider]    Family History History reviewed. No pertinent family history.  Social History Social History   Tobacco Use  . Smoking status: Former Smoker    Years: 5.00  . Smokeless tobacco: Never Used    Substance Use Topics  . Alcohol use: Yes    Comment: Occ  . Drug use: No     Allergies   Peanut oil   Review of Systems Review of Systems  Constitutional: Negative for activity change, appetite change and fever.  HENT: Negative for congestion.   Eyes: Negative for visual disturbance.  Respiratory: Negative for cough, chest tightness and shortness of breath.   Cardiovascular: Negative for chest pain.  Gastrointestinal: Negative for abdominal distention, abdominal pain, diarrhea, nausea and vomiting.  Genitourinary: Negative for dysuria and hematuria.  Musculoskeletal: Positive for arthralgias and myalgias. Negative for back pain.  Skin: Negative for rash.  Neurological: Negative for dizziness, weakness and headaches.   all other systems are negative except as noted in the HPI and PMH.     Physical Exam Updated Vital Signs BP 125/63 (BP Location: Right Arm)   Pulse 87   Temp 98.2 F (36.8 C) (Oral)   Resp 17   Ht 5\' 3"  (1.6 m)   Wt 105.7 kg   LMP 09/24/2018   SpO2  99%   BMI 41.27 kg/m   Physical Exam  Constitutional: She is oriented to person, place, and time. She appears well-developed and well-nourished. No distress.  HENT:  Head: Normocephalic and atraumatic.  Mouth/Throat: Oropharynx is clear and moist. No oropharyngeal exudate.  Eyes: Pupils are equal, round, and reactive to light. Conjunctivae and EOM are normal.  Neck: Normal range of motion. Neck supple.  No meningismus.  Cardiovascular: Normal rate, regular rhythm, normal heart sounds and intact distal pulses.  No murmur heard. Pulmonary/Chest: Effort normal and breath sounds normal. No respiratory distress.  Abdominal: Soft. There is no tenderness. There is no rebound and no guarding.  Musculoskeletal: Normal range of motion. She exhibits tenderness. She exhibits no edema.  Slight area of erythema to the dorsal left foot that is tender to palpation.  There is no deformity.  Intact DP and PT pulse.  Full  range of motion of ankle, knee and hip.  Intact Achilles.  No pain at base of fifth metatarsal. No pain on plantar surface or heel.  Neurological: She is alert and oriented to person, place, and time. No cranial nerve deficit. She exhibits normal muscle tone. Coordination normal.   5/5 strength throughout. CN 2-12 intact.Equal grip strength.   Skin: Skin is warm. Capillary refill takes less than 2 seconds. No rash noted. There is erythema.  Psychiatric: She has a normal mood and affect. Her behavior is normal.  Nursing note and vitals reviewed.    ED Treatments / Results  Labs (all labs ordered are listed, but only abnormal results are displayed) Labs Reviewed - No data to display  EKG None  Radiology Dg Foot Complete Left  Result Date: 10/22/2018 CLINICAL DATA:  Dorsal foot pain EXAM: LEFT FOOT - COMPLETE 3+ VIEW COMPARISON:  None. FINDINGS: There is no evidence of fracture or dislocation. There is no evidence of arthropathy or other focal bone abnormality. Soft tissues are unremarkable. IMPRESSION: Negative. Electronically Signed   By: Jasmine PangKim  Fujinaga M.D.   On: 10/22/2018 23:51    Procedures Procedures (including critical care time)  Medications Ordered in ED Medications  ibuprofen (ADVIL,MOTRIN) tablet 400 mg (has no administration in time range)     Initial Impression / Assessment and Plan / ED Course  I have reviewed the triage vital signs and the nursing notes.  Pertinent labs & imaging results that were available during my care of the patient were reviewed by me and considered in my medical decision making (see chart for details).    Left dorsal foot pain.  Neurovascularly intact.  No evidence of obvious trauma.  Suspect irritation likely from her foot wear.  Will obtain screening x-ray.  X-rays negative.  No fracture or dislocation.  Discussed with patient to wear well fitting hard soled shoes.  You may use rest, ice, elevation, NSAIDs at home.  Follow with PCP.   Return precautions discussed.  Final Clinical Impressions(s) / ED Diagnoses   Final diagnoses:  Foot pain, left    ED Discharge Orders    None       Khyren Hing, Jeannett SeniorStephen, MD 10/23/18 0001

## 2019-02-14 ENCOUNTER — Telehealth: Payer: Self-pay | Admitting: Nurse Practitioner

## 2019-02-14 ENCOUNTER — Telehealth: Payer: Self-pay | Admitting: Family

## 2019-02-14 DIAGNOSIS — J029 Acute pharyngitis, unspecified: Secondary | ICD-10-CM

## 2019-02-14 DIAGNOSIS — R6889 Other general symptoms and signs: Secondary | ICD-10-CM

## 2019-02-14 MED ORDER — OSELTAMIVIR PHOSPHATE 75 MG PO CAPS: 75.0000 mg | ORAL_CAPSULE | Freq: Two times a day (BID) | ORAL | 0 refills | Status: DC

## 2019-02-14 NOTE — Progress Notes (Signed)
Greater than 5 minutes, yet less than 10 minutes of time have been spent researching, coordinating, and implementing care for this patient today.  Thank you for the details you included in the comment boxes. Those details are very helpful in determining the best course of treatment for you and help [place] to provide the best care.  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 At this time, without a fever and severe shortness of breath, this is likely not the flu. It could be any other mild virus infection. That being said, it certainly does not meet the criteria for Coronavirus either. Just wanted to reassure you. See below. Also, use Tylenol, not ibuprofen, for the leg and muscle pain. Do your best to stay hydrated.   We are sorry that you are not feeling well.  Here is how we plan to help!  Your symptoms indicate a likely viral infection (Pharyngitis).   Pharyngitis is inflammation in the back of the throat which can cause a sore throat, scratchiness and sometimes difficulty swallowing.   Pharyngitis is typically caused by a respiratory virus and will just run its course.  Please keep in mind that your symptoms could last up to 10 days.  For throat pain, we recommend over the counter oral pain relief medications such as acetaminophen or aspirin, or anti-inflammatory medications such as ibuprofen or naproxen sodium.  Topical treatments such as oral throat lozenges or sprays may be used as needed.  Avoid close contact with loved ones, especially the very young and elderly.  Remember to wash your hands thoroughly throughout the day as this is the number one way to prevent the spread of infection and wipe down door knobs and counters with disinfectant.  After careful review of your answers, I would not recommend and antibiotic for your condition.  Antibiotics should not be used to treat conditions that we  suspect are caused by viruses like the virus that causes the common cold or flu. However, some people can have Strep with atypical symptoms. You may need formal testing in clinic or office to confirm if your symptoms continue or worsen.  Providers prescribe antibiotics to treat infections caused by bacteria. Antibiotics are very powerful in treating bacterial infections when they are used properly.  To maintain their effectiveness, they should be used only when necessary.  Overuse of antibiotics has resulted in the development of super bugs that are resistant to treatment!    Home Care:  Only take medications as instructed by your medical team.  Do not drink alcohol while taking these medications.  A steam or ultrasonic humidifier can help congestion.  You can place a towel over your head and breathe in the steam from hot water coming from a faucet.  Avoid close contacts especially the very young and the elderly.  Cover your mouth when you cough or sneeze.  Always remember to wash your hands.  Get Help Right Away If:  You develop worsening fever or throat pain.  You develop a severe head ache or visual changes.  Your symptoms persist after you have completed your treatment plan.  Make sure you  Understand these instructions.  Will watch your condition.  Will get help right away if you are not doing well or get worse.       =====================================================   E-Visit for [facility] Virus Screening  Based on your current symptoms,  it seems unlikely that your symptoms are related to the Coronavirus.   Coronavirus disease 2019 (COVID-19) is a respiratory illness that can spread from person to person. The virus that causes COVID-19 is a new virus that was first identified in the country of [place] but is now found in multiple other countries and has spread to the [place].  Symptoms associated with the virus are mild to severe fever, cough, and shortness of  breath. There is currently no vaccine to protect against COVID-19, and there is no specific antiviral treatment for the virus.   To be considered HIGH RISK for Coronavirus (COVID-19), you have to meet the following criteria:  . Traveled to [place], [place], [place], [place] or [place]; or in the [place] to [place], [place], [place], or [place]; and have fever, cough, and shortness of breath within the last 2 weeks of travel OR  . Been in close contact with a person diagnosed with COVID-19 within the last 2 weeks and have fever, cough, and shortness of breath  . IF YOU DO NOT MEET THESE CRITERIA, YOU ARE CONSIDERED LOW RISK FOR COVID-19.   It is vitally important that if you feel that you have an infection such as this virus or any other virus that you stay home and away from places where you may spread it to others.  You should self-quarantine for 14 days if you have symptoms that could potentially be coronavirus and avoid contact with people age 25 and older.    You may also take acetaminophen (Tylenol) as needed for fever.   Reduce your risk of any infection by using the same precautions used for avoiding the common cold or flu:  Marland Kitchen Wash your hands often with soap and warm water for at least 20 seconds.  If soap and water are not readily available, use an alcohol-based hand sanitizer with at least 60% alcohol.  . If coughing or sneezing, cover your mouth and nose by coughing or sneezing into the elbow areas of your shirt or coat, into a tissue or into your sleeve (not your hands). . Avoid shaking hands with others and consider head nods or verbal greetings only. . Avoid touching your eyes, nose, or mouth with unwashed hands.  . Avoid close contact with people who are sick. . Avoid places or events with large numbers of people in one location, like concerts or sporting events. . Carefully consider travel plans you have or are making. . If you are planning any travel outside or inside  the [place], visit the CDC's Travelers' Health webpage for the latest health notices. . If you have some symptoms but not all symptoms, continue to monitor at home and seek medical attention if your symptoms worsen. . If you are having a medical emergency, call 911.  HOME CARE . Only take medications as instructed by your medical team. . Drink plenty of fluids and get plenty of rest. . A steam or ultrasonic humidifier can help if you have congestion.   GET HELP RIGHT AWAY IF: . You develop worsening fever. . You become short of breath . You cough up blood. . Your symptoms become more severe MAKE SURE YOU   Understand these instructions.  Will watch your condition.  Will get help right away if you are not doing well or get worse.  Your e-visit answers were reviewed by a board certified advanced clinical practitioner to complete your personal care plan.  Depending on the condition, your plan  could have included both over the counter or prescription medications.  If there is a problem please reply once you have received a response from your provider. Your safety is important to [place].  If you have drug allergies check your prescription carefully.    You can use MyChart to ask questions about today's visit, request a non-urgent call back, or ask for a work or school excuse for 24 hours related to this e-Visit. If it has been greater than 24 hours you will need to follow up with your provider, or enter a new e-Visit to address those concerns. You will get an e-mail in the next two days asking about your experience.  I hope that your e-visit has been valuable and will speed your recovery. Thank you for using e-visits.

## 2019-02-14 NOTE — Progress Notes (Signed)

## 2019-02-16 ENCOUNTER — Other Ambulatory Visit: Payer: Self-pay

## 2019-02-16 ENCOUNTER — Emergency Department (HOSPITAL_COMMUNITY)
Admission: EM | Admit: 2019-02-16 | Discharge: 2019-02-16 | Disposition: A | Discharge status: Home / Self Care | Payer: Self-pay | Attending: Emergency Medicine | Admitting: Emergency Medicine

## 2019-02-16 ENCOUNTER — Telehealth: Payer: Self-pay | Admitting: Family

## 2019-02-16 ENCOUNTER — Encounter (HOSPITAL_COMMUNITY): Payer: Self-pay | Admitting: *Deleted

## 2019-02-16 ENCOUNTER — Emergency Department (HOSPITAL_COMMUNITY): Payer: Self-pay

## 2019-02-16 DIAGNOSIS — R1310 Dysphagia, unspecified: Secondary | ICD-10-CM

## 2019-02-16 DIAGNOSIS — H538 Other visual disturbances: Secondary | ICD-10-CM

## 2019-02-16 DIAGNOSIS — Z79899 Other long term (current) drug therapy: Secondary | ICD-10-CM | POA: Insufficient documentation

## 2019-02-16 DIAGNOSIS — Z87891 Personal history of nicotine dependence: Secondary | ICD-10-CM | POA: Insufficient documentation

## 2019-02-16 DIAGNOSIS — R0602 Shortness of breath: Secondary | ICD-10-CM

## 2019-02-16 DIAGNOSIS — J988 Other specified respiratory disorders: Secondary | ICD-10-CM

## 2019-02-16 DIAGNOSIS — B9789 Other viral agents as the cause of diseases classified elsewhere: Secondary | ICD-10-CM

## 2019-02-16 DIAGNOSIS — J069 Acute upper respiratory infection, unspecified: Secondary | ICD-10-CM | POA: Insufficient documentation

## 2019-02-16 LAB — COMPREHENSIVE METABOLIC PANEL
ALT: 20 U/L (ref 0–44)
AST: 27 U/L (ref 15–41)
Albumin: 3.7 g/dL (ref 3.5–5.0)
Alkaline Phosphatase: 78 U/L (ref 38–126)
Anion gap: 10 (ref 5–15)
BUN: 13 mg/dL (ref 6–20)
CHLORIDE: 104 mmol/L (ref 98–111)
CO2: 22 mmol/L (ref 22–32)
Calcium: 9.4 mg/dL (ref 8.9–10.3)
Creatinine, Ser: 0.54 mg/dL (ref 0.44–1.00)
GFR calc Af Amer: >60 mL/min (ref >60)
GFR calc non Af Amer: >60 mL/min (ref >60)
Glucose, Bld: 124 mg/dL — ABNORMAL HIGH (ref 70–99)
Potassium: 3.7 mmol/L (ref 3.5–5.1)
Sodium: 136 mmol/L (ref 135–145)
Total Bilirubin: 0.9 mg/dL (ref 0.3–1.2)
Total Protein: 7.7 g/dL (ref 6.5–8.1)

## 2019-02-16 LAB — CBC WITH DIFFERENTIAL/PLATELET
Abs Immature Granulocytes: 0.02 10*3/uL (ref 0.00–0.07)
Basophils Absolute: 0 10*3/uL (ref 0.0–0.1)
Basophils Relative: 0 %
Eosinophils Absolute: 0.2 10*3/uL (ref 0.0–0.5)
Eosinophils Relative: 3 %
HCT: 42.7 % (ref 36.0–46.0)
Hemoglobin: 14.6 g/dL (ref 12.0–15.0)
IMMATURE GRANULOCYTES: 0 %
Lymphocytes Relative: 24 %
Lymphs Abs: 2.2 10*3/uL (ref 0.7–4.0)
MCH: 28.6 pg (ref 26.0–34.0)
MCHC: 34.2 g/dL (ref 30.0–36.0)
MCV: 83.6 fL (ref 80.0–100.0)
Monocytes Absolute: 0.5 10*3/uL (ref 0.1–1.0)
Monocytes Relative: 6 %
NEUTROS ABS: 6 10*3/uL (ref 1.7–7.7)
Neutrophils Relative %: 67 %
Platelets: 236 10*3/uL (ref 150–400)
RBC: 5.11 MIL/uL (ref 3.87–5.11)
RDW: 13.7 % (ref 11.5–15.5)
WBC: 9 10*3/uL (ref 4.0–10.5)
nRBC: 0 % (ref 0.0–0.2)

## 2019-02-16 LAB — URINALYSIS, ROUTINE W REFLEX MICROSCOPIC
BILIRUBIN URINE: NEGATIVE
GLUCOSE, UA: NEGATIVE mg/dL
Hgb urine dipstick: NEGATIVE
Ketones, ur: NEGATIVE mg/dL
Nitrite: NEGATIVE
Protein, ur: NEGATIVE mg/dL
Specific Gravity, Urine: 1.026 (ref 1.005–1.030)
pH: 6 (ref 5.0–8.0)

## 2019-02-16 LAB — LACTIC ACID, PLASMA
Lactic Acid, Venous: 2.5 mmol/L (ref 0.5–1.9)
Lactic Acid, Venous: 1.3 mmol/L (ref 0.5–1.9)

## 2019-02-16 LAB — INFLUENZA PANEL BY PCR (TYPE A & B)
Influenza A By PCR: NEGATIVE
Influenza B By PCR: NEGATIVE

## 2019-02-16 LAB — PREGNANCY, URINE: Preg Test, Ur: NEGATIVE

## 2019-02-16 MED ORDER — SODIUM CHLORIDE 0.9 % IV BOLUS (SEPSIS)
1000.0000 mL | Freq: Once | INTRAVENOUS | Status: AC
  Administered 2019-02-16: 1000 mL via INTRAVENOUS

## 2019-02-16 MED ORDER — ALBUTEROL SULFATE HFA 108 (90 BASE) MCG/ACT IN AERS: 1.0000 | INHALATION_SPRAY | Freq: Four times a day (QID) | RESPIRATORY_TRACT | 0 refills | Status: DC | PRN

## 2019-02-16 MED ORDER — BENZONATATE 100 MG PO CAPS: 100.0000 mg | ORAL_CAPSULE | Freq: Three times a day (TID) | ORAL | 0 refills | Status: DC | PRN

## 2019-02-16 MED ORDER — FLUTICASONE PROPIONATE 50 MCG/ACT NA SUSP: 1.0000 | Freq: Every day | NASAL | 0 refills | Status: DC

## 2019-02-16 MED ORDER — NAPROXEN 500 MG PO TABS: 500.0000 mg | ORAL_TABLET | Freq: Two times a day (BID) | ORAL | 0 refills | Status: DC

## 2019-02-16 MED ORDER — ACETAMINOPHEN 325 MG PO TABS
650.0000 mg | ORAL_TABLET | Freq: Once | ORAL | Status: AC
  Administered 2019-02-16: 650 mg via ORAL
  Filled 2019-02-16: qty 2

## 2019-02-16 MED ORDER — SODIUM CHLORIDE 0.9 % IV BOLUS (SEPSIS): 500.0000 mL | Freq: Once | INTRAVENOUS | Status: DC

## 2019-02-16 NOTE — Progress Notes (Signed)
Based on what you shared with me, I feel your condition warrants further evaluation and I recommend that you be seen for a face to face office visit.  Based on your symptoms of shortness of breath and blurred vision you need to be seen face to face to be evaluated.   Approximately 5 minutes was spent documenting and reviewing patient's chart.     NOTE: If you entered your credit card information for this eVisit, you will not be charged. You may see a "hold" on your card for the $35 but that hold will drop off and you will not have a charge processed.  If you are having a true medical emergency please call 911.  If you need an urgent face to face visit, [place] has four urgent care centers for your convenience.    PLEASE NOTE: THE [facility] LOCATIONS AND URGENT CARE CLINICS DO NOT HAVE THE TESTING FOR CORONAVIRUS COVID19 AVAILABLE.  IF YOU FEEL YOU NEED THIS TEST YOU MUST HAVE AN ORDER TO GO TO A TESTING LOCATION FROM YOUR PROVIDER OR FROM A SCREENING E-VISIT     [email or web address] to reserve your spot online an avoid wait times  [facility] [address], [address] [place], [place] [address] 8 am to 8 pm Monday-Friday 10 am to 4 pm Saturday-Sunday *Across the street from [employer]  [address] [place], [address] 8 am to 5 pm Monday-Friday * In the [facility] on the [facility]   The following sites will take your insurance:  . [facility]  [phone] Get Driving Directions Find a Provider at this Location  [address] [place], [place] [address] . 10 am to 8 pm Monday-Friday . 12 pm to 8 pm Saturday-Sunday   . [facility]  [phone] Get Driving Directions Find a Provider at this Location  [address], [address] [place], [place] [address] . 8 am to 8 pm Monday-Friday . 9 am to 6 pm Saturday . 11 am to 6 pm Sunday   . [facility]  Urgent Care at [facility]  [phone] Get Driving Directions  [address] [place], [place] [address] . 8 am to 8 pm Monday-Friday . 8 am to 4 pm Saturday-Sunday   Your e-visit answers were reviewed by a board certified advanced clinical practitioner to complete your personal care plan.  Thank you for using e-Visits.

## 2019-02-16 NOTE — ED Provider Notes (Addendum)
19:30: Assumed care of patient from supervising physician Dr. Rosalia Hammers at change of shift pending labs & re-assessment. Plan for discharge home with quarantine if work-up without concerning findings & improved in terms of vitals/lactic/sxs on re-assessment.   Please see prior provider note for full H&P.   " 23 year old female no significant past medical history presents today complaining of cough, congestion, fever, dyspnea.  Her symptoms began 2 days ago.  She has had some cough productive of discolored sputum.  She has had temp to 102.  She has not had a temperature elevation today.  She last took antipyretics with NyQuil at about 3 AM.  She has not had nausea, vomiting, or diarrhea.  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  She denies any personal travel or known contact with 19 patients. History reviewed. No pertinent past medical history."   Physical Exam  Pulse (!) 119   Temp 98.4 F (36.9 C) (Oral)   Resp 20   Ht 5\' 3"  (1.6 m)   Wt 113.4 kg   LMP 01/26/2019   SpO2 100%   BMI 44.29 kg/m   Physical Exam Vitals signs and nursing note reviewed.  Constitutional:      Appearance: She is well-developed.  HENT:     Head: Normocephalic and atraumatic.  Eyes:     General:        Right eye: No discharge.        Left eye: No discharge.     Conjunctiva/sclera: Conjunctivae normal.  Cardiovascular:     Rate and Rhythm: Normal rate and regular rhythm.  Pulmonary:     Effort: Pulmonary effort is normal. No respiratory distress.     Breath sounds: No wheezing or rales.  Neurological:     Mental Status: She is alert.     Comments: Clear speech.   Psychiatric:        Behavior: Behavior normal.        Thought Content: Thought content normal.    ED Course/Procedures     Results for orders placed or performed during the hospital encounter of 02/16/19  Lactic acid, plasma  Result Value Ref  Range   Lactic Acid, Venous 2.5 (HH) 0.5 - 1.9 mmol/L  Lactic acid, plasma  Result Value Ref Range   Lactic Acid, Venous 1.3 0.5 - 1.9 mmol/L  Comprehensive metabolic panel  Result Value Ref Range   Sodium 136 135 - 145 mmol/L   Potassium 3.7 3.5 - 5.1 mmol/L   Chloride 104 98 - 111 mmol/L   CO2 22 22 - 32 mmol/L   Glucose, Bld 124 (H) 70 - 99 mg/dL   BUN 13 6 - 20 mg/dL   Creatinine, Ser 4.17 0.44 - 1.00 mg/dL   Calcium 9.4 8.9 - 40.8 mg/dL   Total Protein 7.7 6.5 - 8.1 g/dL   Albumin 3.7 3.5 - 5.0 g/dL   AST 27 15 - 41 U/L   ALT 20 0 - 44 U/L   Alkaline Phosphatase 78 38 - 126 U/L   Total Bilirubin 0.9 0.3 - 1.2 mg/dL   GFR calc non Af Amer >60 >60 mL/min   GFR calc Af Amer >60 >60 mL/min   Anion gap 10 5 - 15  CBC WITH DIFFERENTIAL  Result Value Ref Range   WBC 9.0 4.0 - 10.5 K/uL   RBC 5.11 3.87 - 5.11 MIL/uL  Hemoglobin 14.6 12.0 - 15.0 g/dL   HCT 17.4 94.4 - 96.7 %   MCV 83.6 80.0 - 100.0 fL   MCH 28.6 26.0 - 34.0 pg   MCHC 34.2 30.0 - 36.0 g/dL   RDW 59.1 63.8 - 46.6 %   Platelets 236 150 - 400 K/uL   nRBC 0.0 0.0 - 0.2 %   Neutrophils Relative % 67 %   Neutro Abs 6.0 1.7 - 7.7 K/uL   Lymphocytes Relative 24 %   Lymphs Abs 2.2 0.7 - 4.0 K/uL   Monocytes Relative 6 %   Monocytes Absolute 0.5 0.1 - 1.0 K/uL   Eosinophils Relative 3 %   Eosinophils Absolute 0.2 0.0 - 0.5 K/uL   Basophils Relative 0 %   Basophils Absolute 0.0 0.0 - 0.1 K/uL   Immature Granulocytes 0 %   Abs Immature Granulocytes 0.02 0.00 - 0.07 K/uL  Urinalysis, Routine w reflex microscopic  Result Value Ref Range   Color, Urine YELLOW YELLOW   APPearance HAZY (A) CLEAR   Specific Gravity, Urine 1.026 1.005 - 1.030   pH 6.0 5.0 - 8.0   Glucose, UA NEGATIVE NEGATIVE mg/dL   Hgb urine dipstick NEGATIVE NEGATIVE   Bilirubin Urine NEGATIVE NEGATIVE   Ketones, ur NEGATIVE NEGATIVE mg/dL   Protein, ur NEGATIVE NEGATIVE mg/dL   Nitrite NEGATIVE NEGATIVE   Leukocytes,Ua SMALL (A) NEGATIVE    RBC / HPF 0-5 0 - 5 RBC/hpf   WBC, UA 6-10 0 - 5 WBC/hpf   Bacteria, UA RARE (A) NONE SEEN   Squamous Epithelial / LPF 11-20 0 - 5   Mucus PRESENT   Pregnancy, urine  Result Value Ref Range   Preg Test, Ur NEGATIVE NEGATIVE  Influenza panel by PCR (type A & B)  Result Value Ref Range   Influenza A By PCR NEGATIVE NEGATIVE   Influenza B By PCR NEGATIVE NEGATIVE   Dg Chest 1 View  Result Date: 02/16/2019 CLINICAL DATA:  Dry cough and fever for several days EXAM: CHEST  1 VIEW COMPARISON:  None. FINDINGS: The heart size and mediastinal contours are within normal limits. Both lungs are clear. The visualized skeletal structures are unremarkable. IMPRESSION: No active disease. Electronically Signed   By: Alcide Clever M.D.   On: 02/16/2019 19:19    Procedures  MDM   Work-up reviewed:  CBC: No leukocytosis. No anemia.  CMP: Mild hyperglycemia, otherwise unremarkable UA; Some leuks/bacteria- epithelial cells present, will culture Preg test: Negative.  Influenza: Negative Lactic acid: Initially elevated, improved following fluids.  CXR: Negative. No pneumonia, effusion, edema, or pneumothorax.   Work-up overall re-assuring. Lactic acid normalized. HR/RR significant improved. I personally ambulated patient w/ SpO2 maintaining at 100%. Suspect viral at this time, considering covid 19, however patient does not meet current hospital criteria for testing, will treat symptomatically (flonase, tessalon, naproxen & inhaler- no wheezing on exam, but she feels she is wheezy at night so will try this) & provide quarantine instruction. Patient agreeable with discharge, she states she feels ready to go home. I discussed results, treatment plan, need for follow-up, and return precautions with the patient. Provided opportunity for questions, patient confirmed understanding and is in agreement with plan.   Blood pressure 125/77, pulse 100, temperature 98.2 F (36.8 C), temperature source Oral, resp. rate 20,  height 5\' 3"  (1.6 m), weight 99.8 kg, last menstrual period 01/26/2019, SpO2 100 %, unknown if currently breastfeeding.     Cherly Anderson, [place] 02/16/19 2242  Desmond Lope 02/16/19 2246    Margarita Grizzle, MD 02/20/19 [id number]

## 2019-02-16 NOTE — ED Provider Notes (Signed)
[facility] EMERGENCY DEPARTMENT Provider Note   CSN: [id number] Arrival date & time: 02/16/19  1743    History   Chief Complaint Chief Complaint  Patient presents with  . Shortness of Breath    HPI Sara Jenkins is a 23 y.o. female.     HPI 23 year old female no significant past medical history presents today complaining of cough, congestion, fever, dyspnea.  Her symptoms began 2 days ago.  She has had some cough productive of discolored sputum.  She has had temp to 102.  She has not had a temperature elevation today.  She last took antipyretics with NyQuil at about 3 AM.  She has not had nausea, vomiting, or diarrhea.  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  She denies any personal travel or known contact with 19 patients. History reviewed. No pertinent past medical history.  There are no active problems to display for this patient.   Past Surgical History:  Procedure Laterality Date  . ADENOIDECTOMY  06/2018     OB History    Gravida  5   Para  4   Term      Preterm      AB      Living        SAB      TAB      Ectopic      Multiple      Live Births               Home Medications    Prior to Admission medications   Medication Sig Start Date End Date Taking? Authorizing Provider  acetaminophen (TYLENOL) 500 MG tablet Take 500 mg by mouth every 6 (six) hours as needed for mild pain or moderate pain.    [provider]  naproxen (NAPROSYN) 500 MG tablet Take 1 tablet (500 mg total) by mouth 2 (two) times daily with a meal. 10/22/18   Rancour, Jeannett Senior, MD  oseltamivir (TAMIFLU) 75 MG capsule Take 1 capsule (75 mg total) by mouth 2 (two) times daily. 02/14/19   Bennie Pierini, FNP    Family History History reviewed. No pertinent family history.  Social History Social History   Tobacco Use  . Smoking status:  Former Smoker    Years: 5.00  . Smokeless tobacco: Never Used  Substance Use Topics  . Alcohol use: Yes    Comment: Occ  . Drug use: No     Allergies   Peanut oil   Review of Systems Review of Systems  Constitutional: Positive for fatigue and fever.  HENT: Positive for congestion.   Eyes: Negative.   Respiratory: Positive for cough and shortness of breath.   Cardiovascular: Negative.   Gastrointestinal: Negative.   Endocrine: Negative.   Genitourinary: Negative.   Hematological: Negative.   Psychiatric/Behavioral: Negative.   All other systems reviewed and are negative.    Physical Exam Updated Vital Signs Pulse (!) 119   Temp 98.4 F (36.9 C) (Oral)   Resp 20   Ht 1.6 m (5\' 3" )   Wt 113.4 kg   LMP 01/26/2019   SpO2 100%   BMI 44.29 kg/m   Physical Exam Vitals signs and nursing note reviewed.  Constitutional:      General: She is in acute distress.     Appearance: She is obese. She is ill-appearing.  HENT:  Head: Normocephalic and atraumatic.     Mouth/Throat:     Mouth: Mucous membranes are moist.  Eyes:     Pupils: Pupils are equal, round, and reactive to light.  Neck:     Musculoskeletal: Normal range of motion and neck supple.  Cardiovascular:     Rate and Rhythm: Tachycardia present.  Pulmonary:     Effort: Tachypnea present.     Breath sounds: Rhonchi present.  Chest:     Chest wall: No mass.  Abdominal:     General: Bowel sounds are normal.     Palpations: Abdomen is soft.  Musculoskeletal: Normal range of motion.  Skin:    General: Skin is warm and dry.     Capillary Refill: Capillary refill takes less than 2 seconds.  Neurological:     General: No focal deficit present.     Mental Status: She is alert.  Psychiatric:        Mood and Affect: Mood normal.      ED Treatments / Results  Labs (all labs ordered are listed, but only abnormal results are displayed) Labs Reviewed  LACTIC ACID, PLASMA  LACTIC ACID, PLASMA   COMPREHENSIVE METABOLIC PANEL  CBC WITH DIFFERENTIAL/PLATELET  URINALYSIS, ROUTINE W REFLEX MICROSCOPIC  PREGNANCY, URINE  INFLUENZA PANEL BY PCR (TYPE A & B)    EKG None  Radiology Dg Chest 1 View  Result Date: 02/16/2019 CLINICAL DATA:  Dry cough and fever for several days EXAM: CHEST  1 VIEW COMPARISON:  None. FINDINGS: The heart size and mediastinal contours are within normal limits. Both lungs are clear. The visualized skeletal structures are unremarkable. IMPRESSION: No active disease. Electronically Signed   By: Alcide Clever M.D.   On: 02/16/2019 19:19  CXR reviewed by me and agree with radiologist read  Procedures Procedures (including critical care time)  Medications Ordered in ED Medications  sodium chloride 0.9 % bolus 1,000 mL (has no administration in time range)    And  sodium chloride 0.9 % bolus 1,000 mL (has no administration in time range)    And  sodium chloride 0.9 % bolus 1,000 mL (has no administration in time range)    And  sodium chloride 0.9 % bolus 500 mL (has no administration in time range)     Initial Impression / Assessment and Plan / ED Course  I have reviewed the triage vital signs and the nursing notes.  Pertinent labs & imaging results that were available during my care of the patient were reviewed by me and considered in my medical decision making (see chart for details).    23 year old female presents today with upper respiratory infection symptoms.  She is initially tachycardic to 120.  Initial lactic acid is 2.5.  She is receiving IV fluids. Plan reevaluation of respiratory status Evaluation of heart rate If patient improves and lactic is cleared, she may be discharged home.    Discussed with Harvie Heck, PA and will follow up labs and disposition with home quarantine if able to be d/c'd  Final Clinical Impressions(s) / ED Diagnoses   Final diagnoses:  Viral respiratory illness    ED Discharge Orders    None        Margarita Grizzle, MD 02/16/19 [id number]

## 2019-02-16 NOTE — ED Triage Notes (Signed)
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 Last contact the the family member 3-4 weeks ago.

## 2019-02-16 NOTE — ED Triage Notes (Signed)
PT tmp 98.4 po, Last took tylenol at 0300 today . PT reports she works at [employer]  , [employer], Pt reports she has been running fevers and is [facility] and has had a fever at home but no fever at triage.

## 2019-02-16 NOTE — Discharge Instructions (Addendum)
You were seen in the emergency department today for respiratory illness type symptoms.  Your work-up in the ER was overall reassuring.  Your labs were all fairly normal with the exception of mildly high blood sugar, have this rechecked by primary care.  Your chest x-ray was normal.  We have limited coronavirus testing at this time. We are instructing patient's with cough to quarantine themselves for 14 days. You may be able to discontinue self quarantine if the following conditions are met:   Persons with COVID-19 who have symptoms and were directed to care for themselves at home may discontinue home isolation under the  following conditions: - It has been at least 7 days have passed since symptoms first appeared. - AND at least 3 days (72 hours) have passed since recovery defined as resolution of fever without the use of fever-reducing medications and improvement in respiratory symptoms (e.g., cough, shortness of breath)  Please follow the below quarantine instructions.   We are sending you home with the following medicines to help your symptoms: Flonase: This is a decongestant, use 1 spray per nostril daily Tessalon: This is a cough medicine, take 1 tablet every 8 hours as needed for coughing Naproxen: Naproxen is a nonsteroidal anti-inflammatory medication that will help with pain and swelling. Be sure to take this medication as prescribed with food, 1 pill every 12 hours,  It should be taken with food, as it can cause stomach upset, and more seriously, stomach bleeding. Do not take other nonsteroidal anti-inflammatory medications with this such as Advil, Motrin, Aleve, Mobic, Goodie Powder, or Motrin.  Inhaler: Use 1-2 puffs every 4-6 hours as needed for trouble breathing/wheezing.   You make take Tylenol per over the counter dosing with these medications.   We have prescribed you new medication(s) today. Discuss the medications prescribed today with your pharmacist as they can have adverse  effects and interactions with your other medicines including over the counter and prescribed medications. Seek medical evaluation if you start to experience new or abnormal symptoms after taking one of these medicines, seek care immediately if you start to experience difficulty breathing, feeling of your throat closing, facial swelling, or rash as these could be indications of a more serious allergic reaction   Please follow up with primary care within 2-3 days for re-evaluation- call prior to going to the office to make them aware of your symptoms. Return to the ER for new or worsening symptoms including but not limited to increased work of breathing, fever, chest pain, passing out, or any other concerns.    Follow healthcare providers instructions Make sure that you understand and can help the patient follow any healthcare provider instructions for all care.  Provide for the patients basic needs You should help the patient with basic needs in the home and provide support for getting groceries, prescriptions, and other personal needs.  Monitor the patients symptoms If they are getting sicker, call his or her medical provider and tell them that the patient has, or is being evaluated for, COVID-19 infection. This will help the healthcare providers office take steps to keep other people from getting infected. Ask the healthcare provider to call the local or state health department.  Limit the number of people who have contact with the patient If possible, have only one caregiver for the patient. Other household members should stay in another home or place of residence. If this is not possible, they should stay in another room, or be separated from the patient  as much as possible. Use a separate bathroom, if available. Restrict visitors who do not have an essential need to be in the home.  Keep older adults, very young children, and other sick people away from the patient Keep older adults,  very young children, and those who have compromised immune systems or chronic health conditions away from the patient. This includes people with chronic heart, lung, or kidney conditions, diabetes, and cancer.  Ensure good ventilation Make sure that shared spaces in the home have good air flow, such as from an air conditioner or an opened window, weather permitting.  Wash your hands often Wash your hands often and thoroughly with soap and water for at least 20 seconds. You can use an alcohol based hand sanitizer if soap and water are not available and if your hands are not visibly dirty. Avoid touching your eyes, nose, and mouth with unwashed hands. Use disposable paper towels to dry your hands. If not available, use dedicated cloth towels and replace them when they become wet.  Wear a facemask and gloves Wear a disposable facemask at all times in the room and gloves when you touch or have contact with the patients blood, body fluids, and/or secretions or excretions, such as sweat, saliva, sputum, nasal mucus, vomit, urine, or feces.  Ensure the mask fits over your nose and mouth tightly, and do not touch it during use. Throw out disposable facemasks and gloves after using them. Do not reuse. Wash your hands immediately after removing your facemask and gloves. If your personal clothing becomes contaminated, carefully remove clothing and launder. Wash your hands after handling contaminated clothing. Place all used disposable facemasks, gloves, and other waste in a lined container before disposing them with other household waste. Remove gloves and wash your hands immediately after handling these items.  Do not share dishes, glasses, or other household items with the patient Avoid sharing household items. You should not share dishes, drinking glasses, cups, eating utensils, towels, bedding, or other items with a patient who is confirmed to have, or being evaluated for, COVID-19 infection. After  the person uses these items, you should wash them thoroughly with soap and water.  Wash laundry thoroughly Immediately remove and wash clothes or bedding that have blood, body fluids, and/or secretions or excretions, such as sweat, saliva, sputum, nasal mucus, vomit, urine, or feces, on them. Wear gloves when handling laundry from the patient. Read and follow directions on labels of laundry or clothing items and detergent. In general, wash and dry with the warmest temperatures recommended on the label.  Clean all areas the individual has used often Clean all touchable surfaces, such as counters, tabletops, doorknobs, bathroom fixtures, toilets, phones, keyboards, tablets, and bedside tables, every day. Also, clean any surfaces that may have blood, body fluids, and/or secretions or excretions on them. Wear gloves when cleaning surfaces the patient has come in contact with. Use a diluted bleach solution (e.g., dilute bleach with 1 part bleach and 10 parts water) or a household disinfectant with a label that says EPA-registered for coronaviruses. To make a bleach solution at home, add 1 tablespoon of bleach to 1 quart (4 cups) of water. For a larger supply, add  cup of bleach to 1 gallon (16 cups) of water. Read labels of cleaning products and follow recommendations provided on product labels. Labels contain instructions for safe and effective use of the cleaning product including precautions you should take when applying the product, such as wearing gloves or eye protection  and making sure you have good ventilation during use of the product. Remove gloves and wash hands immediately after cleaning.  Monitor yourself for signs and symptoms of illness Caregivers and household members are considered close contacts, should monitor their health, and will be asked to limit movement outside of the home to the extent possible. Follow the monitoring steps for close contacts listed on the symptom monitoring  form.   ? If you have additional questions, contact your local health department or call the epidemiologist on call at [phone] (available 24/7). ? This guidance is subject to change. For the most up-to-date guidance from [facility], please refer to their website: YouBlogs.pl        If you live with, or provide care at home for, a person confirmed to have, or being evaluated for, COVID-19 infection please follow these guidelines to prevent infection:  Follow healthcare providers instructions Make sure that you understand and can help the patient follow any healthcare provider instructions for all care.  Provide for the patients basic needs You should help the patient with basic needs in the home and provide support for getting groceries, prescriptions, and other personal needs.  Monitor the patients symptoms If they are getting sicker, call his or her medical provider a  This will help the healthcare providers office take steps to keep other people from getting infected. Ask the healthcare provider to call the local or state health department.  Limit the number of people who have contact with the patient If possible, have only one caregiver for the patient. Other household members should stay in another home or place of residence. If this is not possible, they should stay in another room, or be separated from the patient as much as possible. Use a separate bathroom, if available. Restrict visitors who do not have an essential need to be in the home.  Keep older adults, very young children, and other sick people away from the patient Keep older adults, very young children, and those who have compromised immune systems or chronic health conditions away from the patient. This includes people with chronic heart, lung, or kidney conditions, diabetes, and cancer.  Ensure good ventilation Make sure that shared spaces in  the home have good air flow, such as from an air conditioner or an opened window, weather permitting.  Wash your hands often Wash your hands often and thoroughly with soap and water for at least 20 seconds. You can use an alcohol based hand sanitizer if soap and water are not available and if your hands are not visibly dirty. Avoid touching your eyes, nose, and mouth with unwashed hands. Use disposable paper towels to dry your hands. If not available, use dedicated cloth towels and replace them when they become wet.  Wear a facemask and gloves Wear a disposable facemask at all times in the room and gloves when you touch or have contact with the patients blood, body fluids, and/or secretions or excretions, such as sweat, saliva, sputum, nasal mucus, vomit, urine, or feces.  Ensure the mask fits over your nose and mouth tightly, and do not touch it during use. Throw out disposable facemasks and gloves after using them. Do not reuse. Wash your hands immediately after removing your facemask and gloves. If your personal clothing becomes contaminated, carefully remove clothing and launder. Wash your hands after handling contaminated clothing. Place all used disposable facemasks, gloves, and other waste in a lined container before disposing them with other household waste. Remove gloves and wash  your hands immediately after handling these items.  Do not share dishes, glasses, or other household items with the patient Avoid sharing household items. You should not share dishes, drinking glasses, cups, eating utensils, towels, bedding, or other items After the person uses these items, you should wash them thoroughly with soap and water.  Wash laundry thoroughly Immediately remove and wash clothes or bedding that have blood, body fluids, and/or secretions or excretions, such as sweat, saliva, sputum, nasal mucus, vomit, urine, or feces, on them. Wear gloves when handling laundry from the patient. Read  and follow directions on labels of laundry or clothing items and detergent. In general, wash and dry with the warmest temperatures recommended on the label.  Clean all areas the individual has used often Clean all touchable surfaces, such as counters, tabletops, doorknobs, bathroom fixtures, toilets, phones, keyboards, tablets, and bedside tables, every day. Also, clean any surfaces that may have blood, body fluids, and/or secretions or excretions on them. Wear gloves when cleaning surfaces the patient has come in contact with. Use a diluted bleach solution (e.g., dilute bleach with 1 part bleach and 10 parts water) or a household disinfectant with a label that says EPA-registered for coronaviruses. To make a bleach solution at home, add 1 tablespoon of bleach to 1 quart (4 cups) of water. For a larger supply, add  cup of bleach to 1 gallon (16 cups) of water. Read labels of cleaning products and follow recommendations provided on product labels. Labels contain instructions for safe and effective use of the cleaning product including precautions you should take when applying the product, such as wearing gloves or eye protection and making sure you have good ventilation during use of the product. Remove gloves and wash hands immediately after cleaning.  Monitor yourself for signs and symptoms of illness Caregivers and household members are considered close contacts, should monitor their health, and will be asked to limit movement outside of the home to the extent possible. Follow the monitoring steps for close contacts listed on the symptom monitoring form.   ?

## 2019-02-18 LAB — URINE CULTURE

## 2020-08-08 IMAGING — DX DG FOOT COMPLETE 3+V*L*
3 series · 3 of 3 positions shown · non-contrast
Comparison: None.

CLINICAL DATA: Dorsal foot pain

EXAM:
LEFT FOOT - COMPLETE 3+ VIEW

[foot ap]
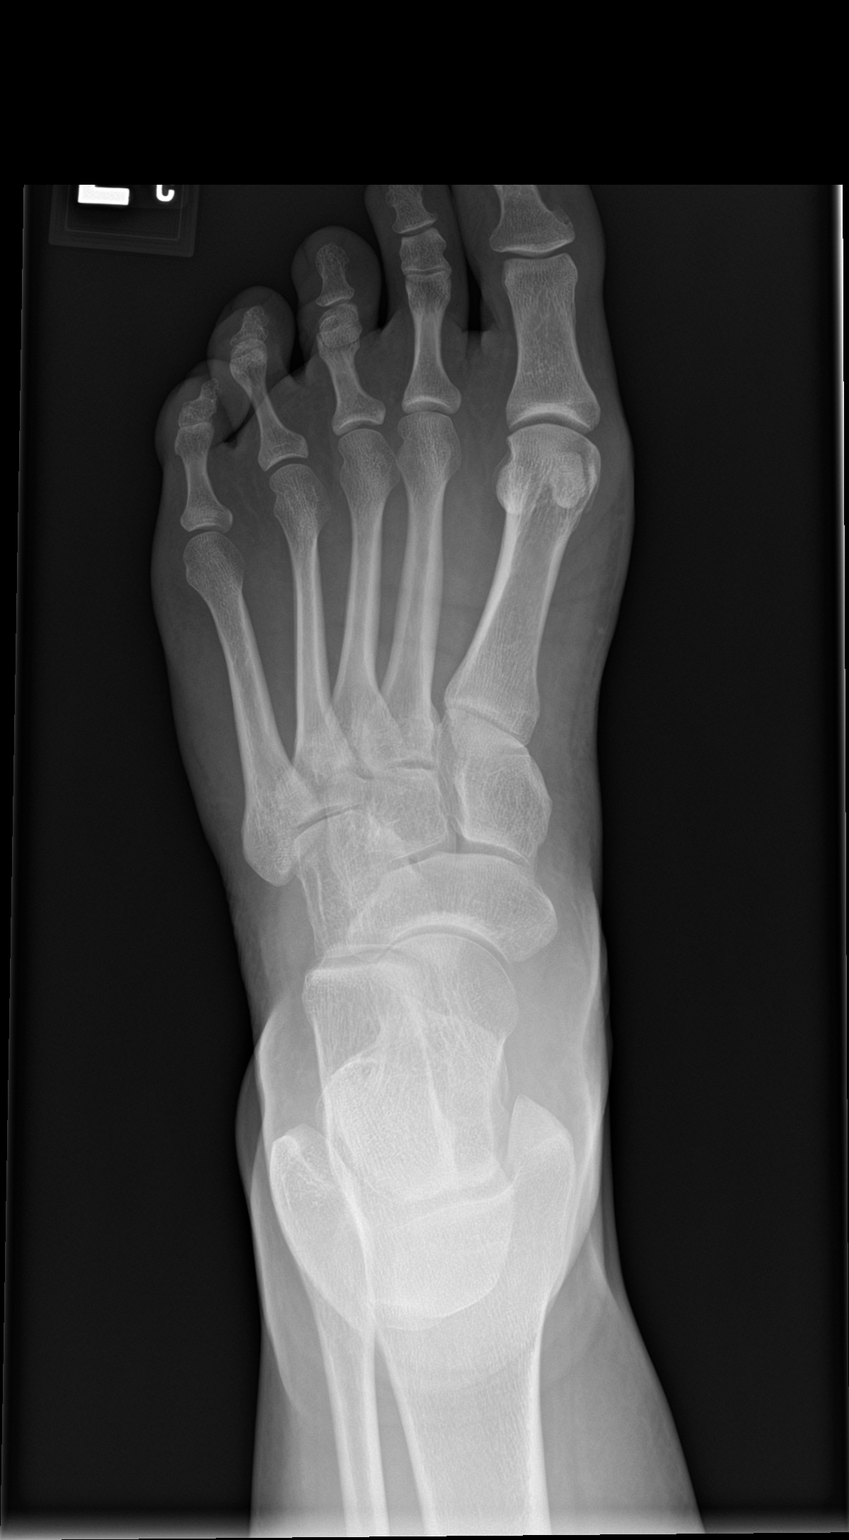

[foot obl]
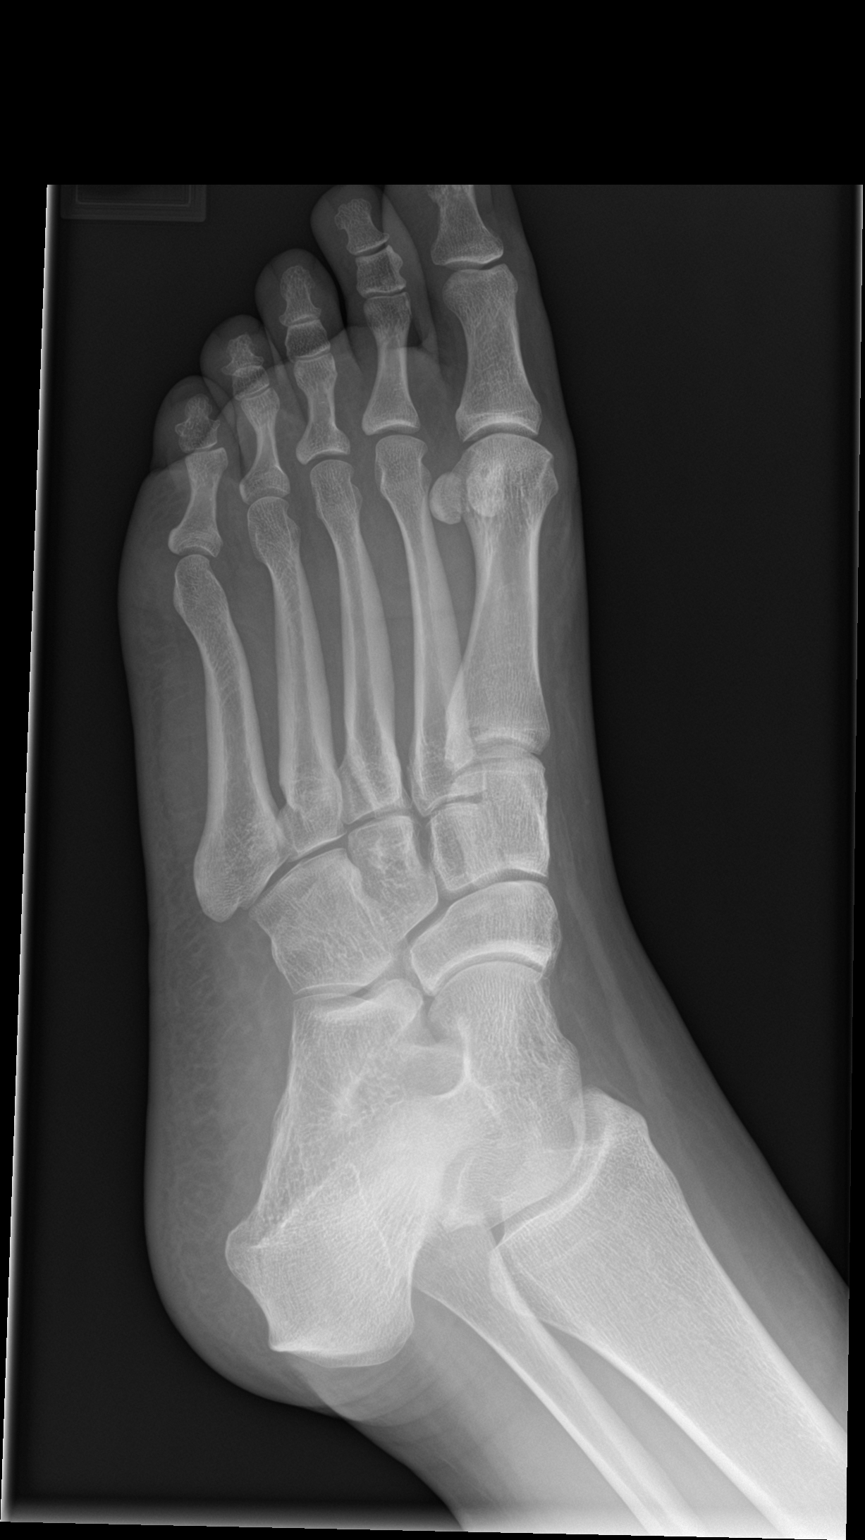

[foot lat]
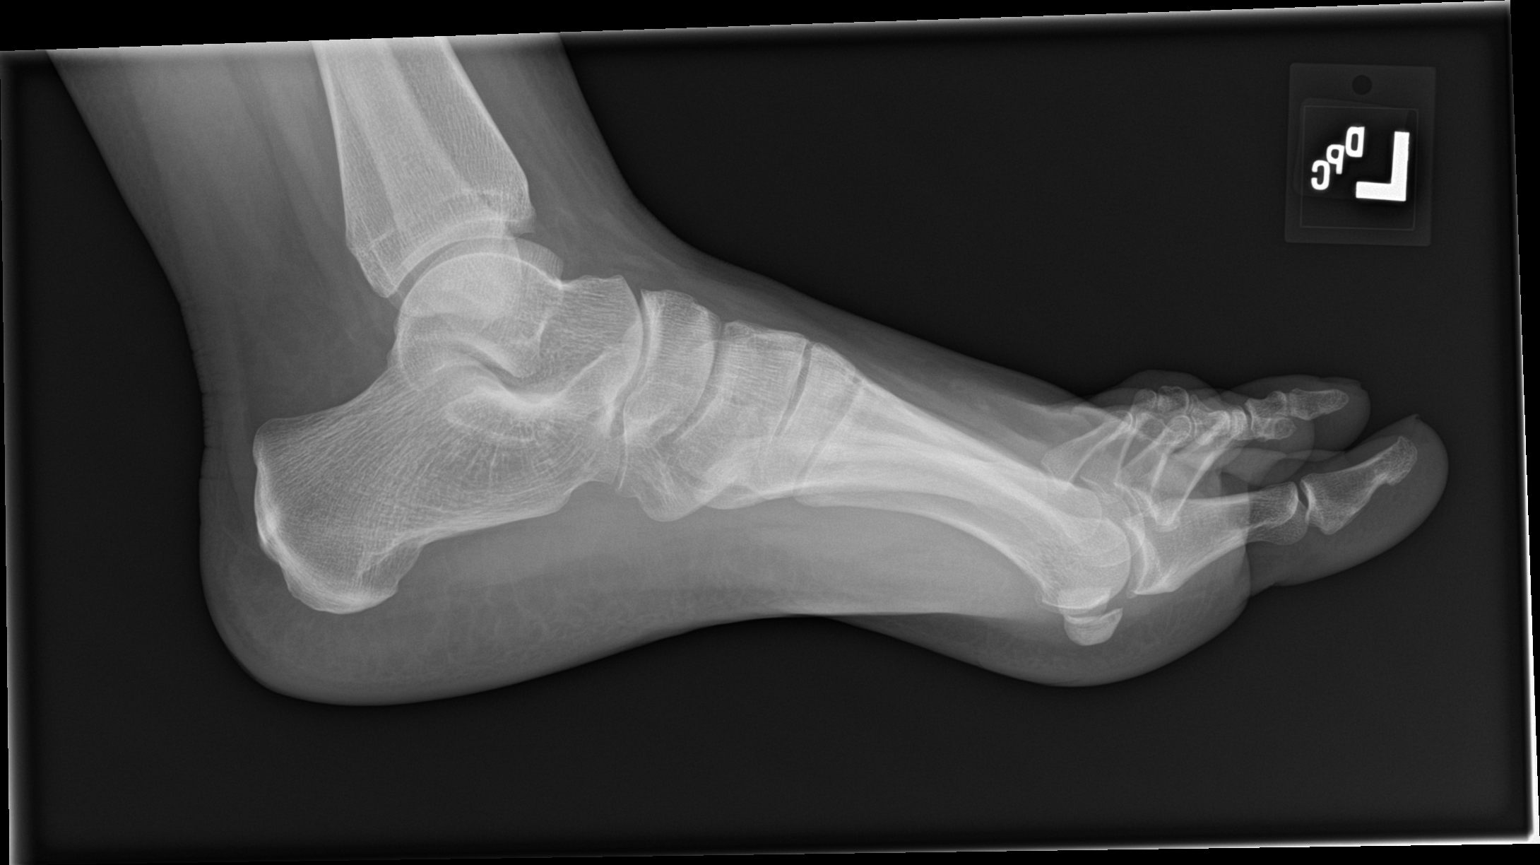

[3 of 3 positions shown; findings below may reference images not displayed]

FINDINGS: There is no evidence of fracture or dislocation. There is no
evidence of arthropathy or other focal bone abnormality. Soft
tissues are unremarkable.
IMPRESSION: Negative.

## 2020-10-05 DIAGNOSIS — Z114 Encounter for screening for human immunodeficiency virus [HIV]: Secondary | ICD-10-CM | POA: Diagnosis not present

## 2020-10-05 DIAGNOSIS — Z113 Encounter for screening for infections with a predominantly sexual mode of transmission: Secondary | ICD-10-CM | POA: Diagnosis not present

## 2020-11-06 DIAGNOSIS — Z20822 Contact with and (suspected) exposure to covid-19: Secondary | ICD-10-CM | POA: Diagnosis not present

## 2020-11-26 ENCOUNTER — Ambulatory Visit
Admission: EM | Admit: 2020-11-26 | Discharge: 2020-11-26 | Disposition: A | Discharge status: Home / Self Care | Payer: Medicaid Other | Attending: Family Medicine | Admitting: Family Medicine

## 2020-11-26 ENCOUNTER — Other Ambulatory Visit: Payer: Self-pay

## 2020-11-26 DIAGNOSIS — B349 Viral infection, unspecified: Secondary | ICD-10-CM | POA: Diagnosis not present

## 2020-11-26 DIAGNOSIS — R6889 Other general symptoms and signs: Secondary | ICD-10-CM | POA: Diagnosis not present

## 2020-11-26 LAB — POCT RAPID STREP A (OFFICE): Rapid Strep A Screen: NEGATIVE

## 2020-11-26 NOTE — ED Triage Notes (Signed)
Pt states she has had uri symptoms and had negative covid test at work, sore throat began this morning

## 2020-11-26 NOTE — ED Provider Notes (Signed)
[facility]    CSN: [id number] Arrival date & time: 11/26/20  0847      History   Chief Complaint Chief Complaint  Patient presents with  . Sore Throat    HPI Sara Jenkins is a 24 y.o. female.   Patient is a 24 year old female who presents today with nasal congestion, rhinorrhea, sore throat.  This started this morning.  Tested negative for Covid earlier this week.  Gets tested every Monday.  No fevers, chills, cough, chest congestion.     History reviewed. No pertinent past medical history.  There are no problems to display for this patient.   Past Surgical History:  Procedure Laterality Date  . ADENOIDECTOMY  06/2018    OB History    Gravida  5   Para  4   Term      Preterm      AB      Living        SAB      IAB      Ectopic      Multiple      Live Births               Home Medications    Prior to Admission medications   Medication Sig Start Date End Date Taking? Authorizing Provider  acetaminophen (TYLENOL) 500 MG tablet Take 500 mg by mouth every 6 (six) hours as needed for mild pain or moderate pain.    [provider]  albuterol (PROVENTIL HFA;VENTOLIN HFA) 108 (90 Base) MCG/ACT inhaler Inhale 1-2 puffs into the lungs every 6 (six) hours as needed for wheezing or shortness of breath. 02/16/19   Petrucelli, Samantha R, PA-C  benzonatate (TESSALON) 100 MG capsule Take 1 capsule (100 mg total) by mouth 3 (three) times daily as needed for cough. 02/16/19   Petrucelli, Samantha R, PA-C  fluticasone (FLONASE) 50 MCG/ACT nasal spray Place 1 spray into both nostrils daily. 02/16/19   Petrucelli, Samantha R, PA-C  naproxen (NAPROSYN) 500 MG tablet Take 1 tablet (500 mg total) by mouth 2 (two) times daily. 02/16/19   Petrucelli, Pleas Koch, PA-C    Family History Family History  Family history unknown: Yes    Social History Social History   Tobacco Use  . Smoking status: Former Smoker    Years: 5.00  . Smokeless  tobacco: Never Used  Vaping Use  . Vaping Use: Former  Substance Use Topics  . Alcohol use: Yes    Comment: Occ  . Drug use: No     Allergies   Peanut oil   Review of Systems Review of Systems   Physical Exam Triage Vital Signs ED Triage Vitals  Enc Vitals Group     BP 11/26/20 0915 118/81     Pulse Rate 11/26/20 0915 96     Resp 11/26/20 0915 18     Temp 11/26/20 0915 98.8 F (37.1 C)     Temp src --      SpO2 11/26/20 0915 98 %     Weight --      Height --      Head Circumference --      Peak Flow --      Pain Score 11/26/20 0913 5     Pain Loc --      Pain Edu? --      Excl. in GC? --    No data found.  Updated Vital Signs BP 118/81   Pulse 96   Temp 98.8  F (37.1 C)   Resp 18   LMP  (LMP Unknown)   SpO2 98%   Visual Acuity Right Eye Distance:   Left Eye Distance:   Bilateral Distance:    Right Eye Near:   Left Eye Near:    Bilateral Near:     Physical Exam Vitals and nursing note reviewed.  Constitutional:      General: She is not in acute distress.    Appearance: Normal appearance. She is not ill-appearing, toxic-appearing or diaphoretic.  HENT:     Head: Normocephalic.     Right Ear: Tympanic membrane and ear canal normal.     Left Ear: Tympanic membrane and ear canal normal.     Nose: Nose normal.     Mouth/Throat:     Pharynx: Oropharynx is clear.  Eyes:     Conjunctiva/sclera: Conjunctivae normal.  Pulmonary:     Effort: Pulmonary effort is normal.  Musculoskeletal:        General: Normal range of motion.     Cervical back: Normal range of motion.  Skin:    General: Skin is warm and dry.     Findings: No rash.  Neurological:     Mental Status: She is alert.  Psychiatric:        Mood and Affect: Mood normal.      UC Treatments / Results  Labs (all labs ordered are listed, but only abnormal results are displayed) Labs Reviewed  CULTURE, GROUP A STREP ([facility])  NOVEL CORONAVIRUS, NAA  POCT RAPID STREP A (OFFICE)     EKG   Radiology No results found.  Procedures Procedures (including critical care time)  Medications Ordered in UC Medications - No data to display  Initial Impression / Assessment and Plan / UC Course  I have reviewed the triage vital signs and the nursing notes.  Pertinent labs & imaging results that were available during my care of the patient were reviewed by me and considered in my medical decision making (see chart for details).     Sore throat Most likely viral.  Strep test negative. Over-the-counter medicines as needed.  Covid swab pending. Follow up as needed for continued or worsening symptoms  Final Clinical Impressions(s) / UC Diagnoses   Final diagnoses:  Viral illness     Discharge Instructions     This is most likely something viral.  Checking you for Covid.  You can take over-the-counter medicines as needed.  Your strep test was negative    ED Prescriptions    None     PDMP not reviewed this encounter.   Dahlia Byes A, NP 11/26/20 1000

## 2020-11-26 NOTE — Discharge Instructions (Addendum)
This is most likely something viral.  Checking you for Covid.  You can take over-the-counter medicines as needed.  Your strep test was negative

## 2020-11-29 LAB — CULTURE, GROUP A STREP (THRC)

## 2020-11-30 LAB — NOVEL CORONAVIRUS, NAA: SARS-CoV-2, NAA: NOT DETECTED

## 2020-12-03 IMAGING — DX CHEST  1 VIEW
1 series · 1 of 1 positions shown · non-contrast
Comparison: None.

CLINICAL DATA: Dry cough and fever for several days

EXAM:
CHEST  1 VIEW

[chest]
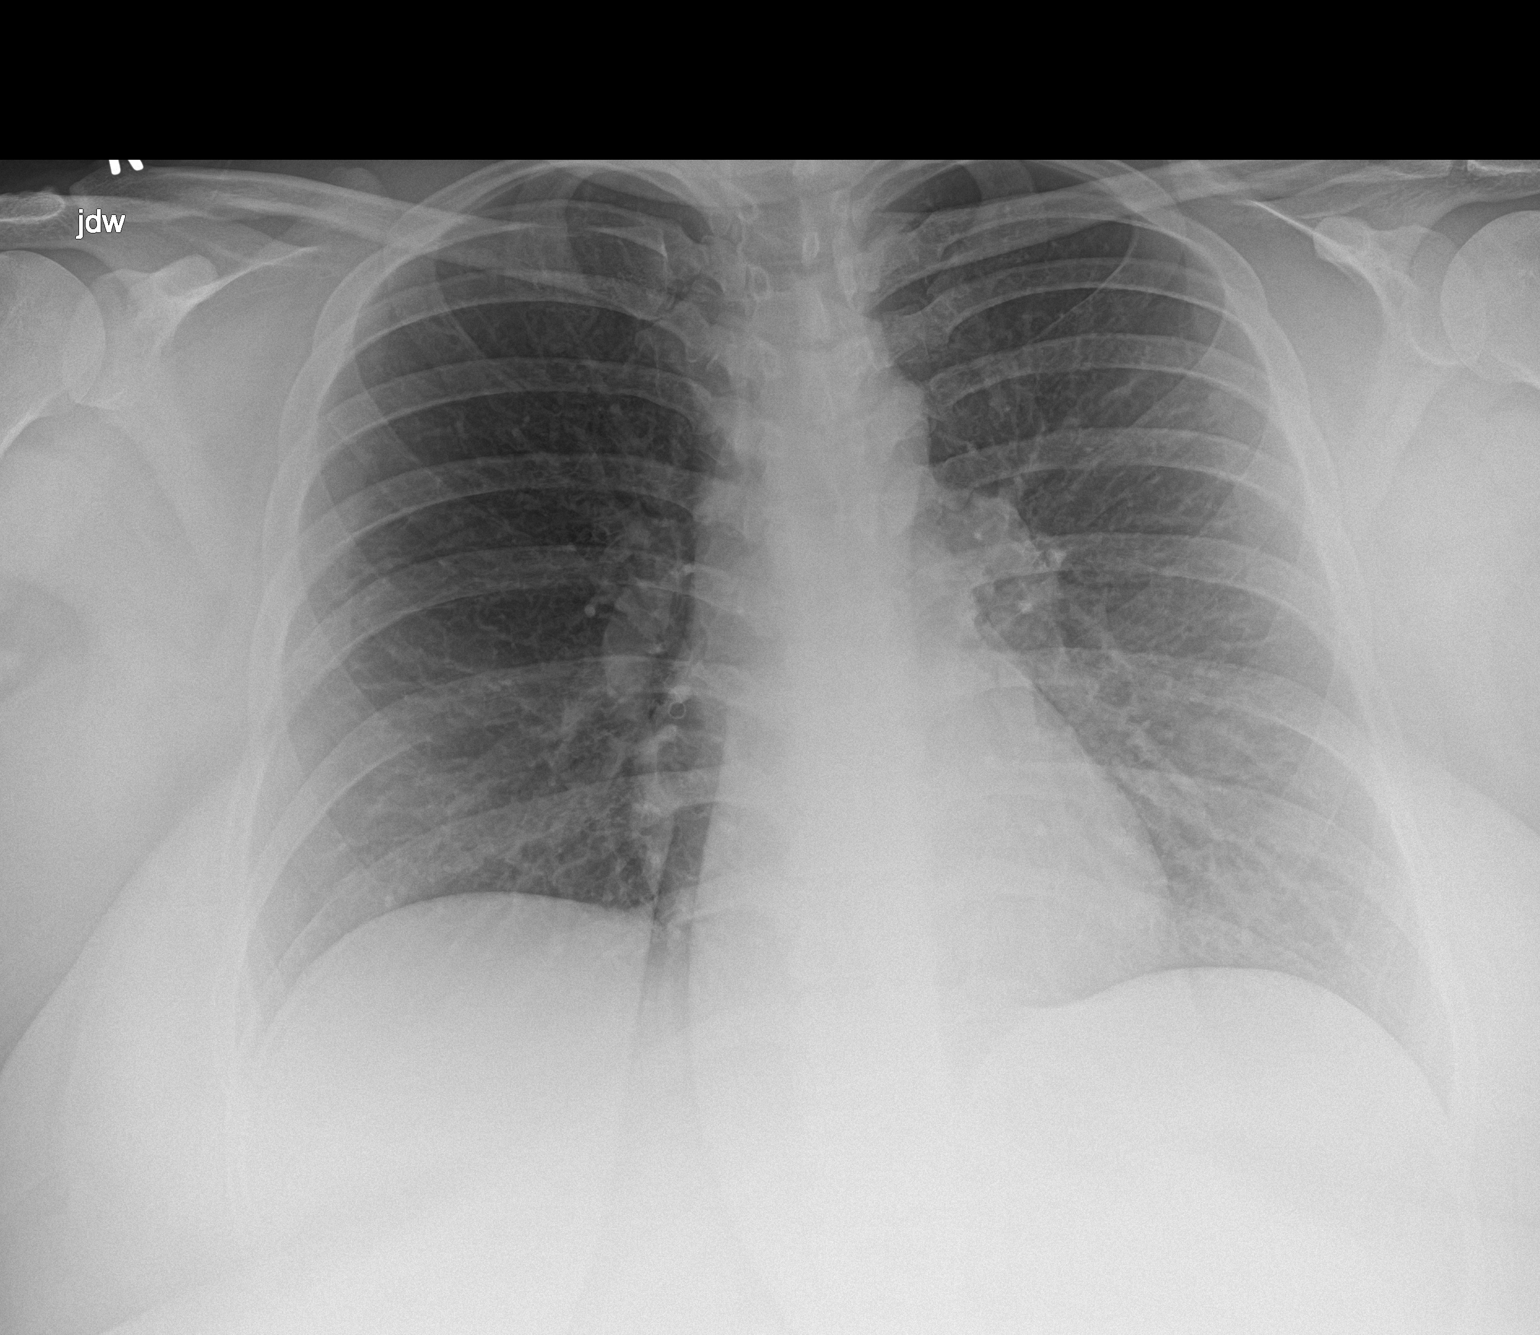

[1 of 1 positions shown; findings below may reference images not displayed]

FINDINGS: The heart size and mediastinal contours are within normal limits.
Both lungs are clear. The visualized skeletal structures are
unremarkable.
IMPRESSION: No active disease.

## 2020-12-10 ENCOUNTER — Ambulatory Visit
Admission: EM | Admit: 2020-12-10 | Discharge: 2020-12-10 | Disposition: A | Discharge status: Home / Self Care | Payer: Medicaid Other | Attending: Emergency Medicine | Admitting: Emergency Medicine

## 2020-12-10 ENCOUNTER — Other Ambulatory Visit: Payer: Self-pay

## 2020-12-10 ENCOUNTER — Encounter: Payer: Self-pay | Admitting: Emergency Medicine

## 2020-12-10 DIAGNOSIS — Z1152 Encounter for screening for COVID-19: Secondary | ICD-10-CM

## 2020-12-10 DIAGNOSIS — J069 Acute upper respiratory infection, unspecified: Secondary | ICD-10-CM | POA: Diagnosis not present

## 2020-12-10 DIAGNOSIS — R0602 Shortness of breath: Secondary | ICD-10-CM

## 2020-12-10 DIAGNOSIS — Z20822 Contact with and (suspected) exposure to covid-19: Secondary | ICD-10-CM | POA: Diagnosis not present

## 2020-12-10 MED ORDER — CETIRIZINE HCL 10 MG PO TABS: 10.0000 mg | ORAL_TABLET | Freq: Every day | ORAL | 0 refills | Status: DC

## 2020-12-10 MED ORDER — BENZONATATE 100 MG PO CAPS: 100.0000 mg | ORAL_CAPSULE | Freq: Three times a day (TID) | ORAL | 0 refills | Status: DC | PRN

## 2020-12-10 MED ORDER — DEXAMETHASONE 4 MG PO TABS
4.0000 mg | ORAL_TABLET | Freq: Every day | ORAL | 0 refills | Status: AC
Start: 2020-12-10 — End: 2020-12-17

## 2020-12-10 MED ORDER — ALBUTEROL SULFATE HFA 108 (90 BASE) MCG/ACT IN AERS: 1.0000 | INHALATION_SPRAY | Freq: Four times a day (QID) | RESPIRATORY_TRACT | 0 refills | Status: AC | PRN

## 2020-12-10 NOTE — ED Provider Notes (Signed)
[facility]   [id number] 12/10/20 Arrival Time: 1137  CC: COVID symptoms   SUBJECTIVE: History from: family.  Sara Jenkins is a 25 y.o. female who presented to the urgent care for complaint of cough, headache and shortness of breath that started yesterday.  Denies any precipitating event or exposure to COVID, strep, flu or RSV.  Has tried OTC medication without relief.  Denies similar symptoms in the past.  Denies chills, fever, nausea, vomiting, diarrhea.  ROS: As per HPI.  All other pertinent ROS negative.     History reviewed. No pertinent past medical history. Past Surgical History:  Procedure Laterality Date  . ADENOIDECTOMY  06/2018   Allergies  Allergen Reactions  . Peanut Oil Anaphylaxis   No current facility-administered medications on file prior to encounter.   Current Outpatient Medications on File Prior to Encounter  Medication Sig Dispense Refill  . acetaminophen (TYLENOL) 500 MG tablet Take 500 mg by mouth every 6 (six) hours as needed for mild pain or moderate pain.    . fluticasone (FLONASE) 50 MCG/ACT nasal spray Place 1 spray into both nostrils daily. 16 g 0  . naproxen (NAPROSYN) 500 MG tablet Take 1 tablet (500 mg total) by mouth 2 (two) times daily. 10 tablet 0   Social History   Socioeconomic History  . Marital status: Single    Spouse name: Not on file  . Number of children: Not on file  . Years of education: Not on file  . Highest education level: Not on file  Occupational History  . Not on file  Tobacco Use  . Smoking status: Former Smoker    Years: 5.00  . Smokeless tobacco: Never Used  Vaping Use  . Vaping Use: Former  Substance and Sexual Activity  . Alcohol use: Yes    Comment: Occ  . Drug use: No  . Sexual activity: Yes    Birth control/protection: None  Other Topics Concern  . Not on file  Social History Narrative  . Not on file   Social Determinants of Health   Financial Resource Strain: Not on file  Food  Insecurity: Not on file  Transportation Needs: Not on file  Physical Activity: Not on file  Stress: Not on file  Social Connections: Not on file  Intimate Partner Violence: Not on file   Family History  Family history unknown: Yes    OBJECTIVE:  Vitals:   12/10/20 1304  BP: 130/90  Pulse: (!) 113  Resp: 20  Temp: (!) 97.5 F (36.4 C)  TempSrc: Oral  SpO2: 98%  Weight: 240 lb (108.9 kg)  Height: 5\' 3"  (1.6 m)     General appearance: alert; smiling and laughing during encounter; nontoxic appearance HEENT: NCAT; Ears: EACs clear, TMs pearly gray; Eyes: PERRL.  EOM grossly intact. Nose: no rhinorrhea without nasal flaring; Throat: oropharynx clear, tolerating own secretions, tonsils not erythematous or enlarged, uvula midline Neck: supple without LAD; FROM Lungs: CTA bilaterally without adventitious breath sounds; normal respiratory effort, no belly breathing or accessory muscle use;  cough present Heart: regular rate and rhythm.  Radial pulses 2+ symmetrical bilaterally Abdomen: soft; normal active bowel sounds; nontender to palpation Skin: warm and dry; no obvious rashes Psychological: alert and cooperative; normal mood and affect appropriate for age   ASSESSMENT & PLAN:  1. Viral URI with cough   2. Encounter for screening for COVID-19   3. Shortness of breath     Meds ordered this encounter  Medications  . dexamethasone (  DECADRON) 4 MG tablet    Sig: Take 1 tablet (4 mg total) by mouth daily for 7 days.    Dispense:  7 tablet    Refill:  0  . benzonatate (TESSALON) 100 MG capsule    Sig: Take 1 capsule (100 mg total) by mouth 3 (three) times daily as needed for cough.    Dispense:  30 capsule    Refill:  0  . albuterol (VENTOLIN HFA) 108 (90 Base) MCG/ACT inhaler    Sig: Inhale 1-2 puffs into the lungs every 6 (six) hours as needed for wheezing or shortness of breath.    Dispense:  18 g    Refill:  0  . cetirizine (ZYRTEC ALLERGY) 10 MG tablet    Sig: Take 1  tablet (10 mg total) by mouth daily.    Dispense:  30 tablet    Refill:  0     Discharge instructions  COVID testing ordered.  It may take between 2 - 7 days for test results  Prescribed Tessalon Perles for cough  Decadron was prescribed ProAir was prescribed for shortness of breath Prescribed zyrtec for nasal congestion.  Use daily for symptomatic relief Continue to alternate Children's tylenol/ motrin as needed for pain and fever Follow up with PCP Call or go to the ED if child has any new or worsening symptoms like fever, decreased appetite, decreased activity, turning blue, nasal flaring, rib retractions, wheezing, rash, changes in bowel or bladder habits, etc...   Reviewed expectations re: course of current medical issues. Questions answered. Outlined signs and symptoms indicating need for more acute intervention. Patient verbalized understanding. After Visit Summary given.          Durward Parcel, FNP 12/10/20 1416

## 2020-12-10 NOTE — Discharge Instructions (Signed)
COVID testing ordered.  It may take between 2 - 7 days for test results  Prescribed Tessalon Perles for cough  Decadron was prescribed ProAir was prescribed for shortness of breath Prescribed zyrtec for nasal congestion.  Use daily for symptomatic relief Continue to alternate Children's tylenol/ motrin as needed for pain and fever Follow up with PCP Call or go to the ED if child has any new or worsening symptoms like fever, decreased appetite, decreased activity, turning blue, nasal flaring, rib retractions, wheezing, rash, changes in bowel or bladder habits, etc..Marland Kitchen

## 2020-12-10 NOTE — ED Triage Notes (Signed)
Cough, headache, SOB since yesterday

## 2020-12-12 LAB — COVID-19, FLU A+B NAA
Influenza A, NAA: NOT DETECTED
Influenza B, NAA: NOT DETECTED
SARS-CoV-2, NAA: DETECTED — AB

## 2020-12-19 ENCOUNTER — Encounter (HOSPITAL_COMMUNITY): Payer: Self-pay | Admitting: *Deleted

## 2020-12-19 ENCOUNTER — Ambulatory Visit (HOSPITAL_COMMUNITY)
Admission: EM | Admit: 2020-12-19 | Discharge: 2020-12-19 | Disposition: A | Discharge status: Home / Self Care | Payer: Medicaid Other | Attending: Student | Admitting: Student

## 2020-12-19 ENCOUNTER — Other Ambulatory Visit: Payer: Self-pay

## 2020-12-19 DIAGNOSIS — S46912A Strain of unspecified muscle, fascia and tendon at shoulder and upper arm level, left arm, initial encounter: Secondary | ICD-10-CM

## 2020-12-19 MED ORDER — TIZANIDINE HCL 4 MG PO CAPS: 4.0000 mg | ORAL_CAPSULE | Freq: Three times a day (TID) | ORAL | 0 refills | Status: AC

## 2020-12-19 MED ORDER — IBUPROFEN 800 MG PO TABS: 800.0000 mg | ORAL_TABLET | Freq: Three times a day (TID) | ORAL | 0 refills | Status: AC

## 2020-12-19 NOTE — Discharge Instructions (Addendum)
-  Ibuprofen 800mg  3x daily and zanaflex up to 3x daily, for 3-4 days -Zanaflex could make you drowsy, so take before bedtime  -Gentle range of motion exercises (information provided in this packet) -if symptoms worsen/persist despite this treatment plan, follow-up with or orthopedist (information below)

## 2020-12-19 NOTE — ED Triage Notes (Signed)
Pt reports yesterday she was bending over to pick up something and Lt shoulder started to hurt.

## 2020-12-19 NOTE — ED Provider Notes (Signed)
[facility]    CSN: [id number] Arrival date & time: 12/19/20  1219      History   Chief Complaint Chief Complaint  Patient presents with  . Shoulder Pain    LT    HPI Sara Jenkins is a 25 y.o. female presenting with left shoulder pain x1 day since bending over to pick something up at work. States pain began immediately upon bending. Radiates down to left elbow. She is right handed. Pt notes recent history of covid19. She felt better and returned to work, and then this occurred.   HPI  History reviewed. No pertinent past medical history.  There are no problems to display for this patient.   Past Surgical History:  Procedure Laterality Date  . ADENOIDECTOMY  06/2018    OB History    Gravida  5   Para  4   Term      Preterm      AB      Living        SAB      IAB      Ectopic      Multiple      Live Births               Home Medications    Prior to Admission medications   Medication Sig Start Date End Date Taking? Authorizing Provider  albuterol (VENTOLIN HFA) 108 (90 Base) MCG/ACT inhaler Inhale 1-2 puffs into the lungs every 6 (six) hours as needed for wheezing or shortness of breath. 12/10/20  Yes Avegno, Zachery Dakins, FNP  ibuprofen (ADVIL) 800 MG tablet Take 1 tablet (800 mg total) by mouth 3 (three) times daily. 12/19/20  Yes Rhys Martini, PA-C  tiZANidine (ZANAFLEX) 4 MG capsule Take 1 capsule (4 mg total) by mouth 3 (three) times daily for 7 days. 12/19/20 12/26/20 Yes Rhys Martini, PA-C  cetirizine (ZYRTEC ALLERGY) 10 MG tablet Take 1 tablet (10 mg total) by mouth daily. 12/10/20 12/19/20  Avegno, Zachery Dakins, FNP  fluticasone (FLONASE) 50 MCG/ACT nasal spray Place 1 spray into both nostrils daily. 02/16/19 12/19/20  Petrucelli, Pleas Koch, PA-C    Family History Family History  Family history unknown: Yes    Social History Social History   Tobacco Use  . Smoking status: Former Smoker    Years: 5.00  . Smokeless tobacco:  Never Used  Vaping Use  . Vaping Use: Former  Substance Use Topics  . Alcohol use: Yes    Comment: Occ  . Drug use: No     Allergies   Peanut oil   Review of Systems Review of Systems  Musculoskeletal:       L shoulder pain   All other systems reviewed and are negative.    Physical Exam Triage Vital Signs ED Triage Vitals  Enc Vitals Group     BP 12/19/20 1334 115/75     Pulse Rate 12/19/20 1334 (!) 113     Resp 12/19/20 1334 18     Temp 12/19/20 1334 99.3 F (37.4 C)     Temp Source 12/19/20 1334 Oral     SpO2 12/19/20 1334 97 %     Weight --      Height --      Head Circumference --      Peak Flow --      Pain Score 12/19/20 1330 7     Pain Loc --      Pain Edu? --  Excl. in GC? --    No data found.  Updated Vital Signs BP 115/75 (BP Location: Right Arm)   Pulse (!) 113   Temp 99.3 F (37.4 C) (Oral)   Resp 18   LMP  (LMP Unknown)   SpO2 97%   Visual Acuity Right Eye Distance:   Left Eye Distance:   Bilateral Distance:    Right Eye Near:   Left Eye Near:    Bilateral Near:     Physical Exam Vitals reviewed.  Constitutional:      Appearance: Normal appearance.  Cardiovascular:     Rate and Rhythm: Normal rate and regular rhythm.     Heart sounds: Normal heart sounds.  Pulmonary:     Effort: Pulmonary effort is normal.     Breath sounds: Normal breath sounds.  Musculoskeletal:     Right shoulder: Normal.     Left shoulder: Tenderness present. No swelling, deformity, effusion, laceration, bony tenderness or crepitus. Normal strength. Normal pulse.     Cervical back: Normal. No swelling, deformity, signs of trauma, spasms, tenderness or bony tenderness.     Thoracic back: Normal. No swelling, deformity, spasms, tenderness or bony tenderness.     Lumbar back: Normal. No swelling, deformity, spasms, tenderness or bony tenderness.     Comments: L shoulder tenderness to palpation of proximal trapezius muscle. Pain elicited with abduction and  internal rotation. No ecchymosis, bony deformity. Negative empty beer can, hawkins, neer.   Neurological:     General: No focal deficit present.     Mental Status: She is alert and oriented to person, place, and time.  Psychiatric:        Mood and Affect: Mood normal.        Behavior: Behavior normal.        Thought Content: Thought content normal.        Judgment: Judgment normal.      UC Treatments / Results  Labs (all labs ordered are listed, but only abnormal results are displayed) Labs Reviewed - No data to display  EKG   Radiology No results found.  Procedures Procedures (including critical care time)  Medications Ordered in UC Medications - No data to display  Initial Impression / Assessment and Plan / UC Course  I have reviewed the triage vital signs and the nursing notes.  Pertinent labs & imaging results that were available during my care of the patient were reviewed by me and considered in my medical decision making (see chart for details).     For left shoulder strain, ibuprofen and zanaflex as below.  F/u with ortho or this clinic if symptoms worsen/persist despite treatment (information provided) Limit heavy lifting at work- note provided.  Final Clinical Impressions(s) / UC Diagnoses   Final diagnoses:  Strain of left shoulder, initial encounter     Discharge Instructions     -Ibuprofen 800mg  3x daily and zanaflex up to 3x daily, for 3-4 days -Zanaflex could make you drowsy, so take before bedtime  -Gentle range of motion exercises (information provided in this packet) -if symptoms worsen/persist despite this treatment plan, follow-up with or orthopedist (information below)   ED Prescriptions    Medication Sig Dispense Auth. Provider   tiZANidine (ZANAFLEX) 4 MG capsule Take 1 capsule (4 mg total) by mouth 3 (three) times daily for 7 days. 21 capsule [place], PA-C   ibuprofen (ADVIL) 800 MG tablet Take 1 tablet (800 mg total) by  mouth 3 (three) times daily.  21 tablet Rhys Martini, PA-C     PDMP not reviewed this encounter.   Rhys Martini, PA-C 12/19/20 1439

## 2020-12-23 ENCOUNTER — Other Ambulatory Visit: Payer: Self-pay

## 2020-12-23 ENCOUNTER — Ambulatory Visit
Admission: EM | Admit: 2020-12-23 | Discharge: 2020-12-23 | Disposition: A | Discharge status: Home / Self Care | Payer: Medicaid Other | Attending: Emergency Medicine | Admitting: Emergency Medicine

## 2020-12-23 DIAGNOSIS — S46912D Strain of unspecified muscle, fascia and tendon at shoulder and upper arm level, left arm, subsequent encounter: Secondary | ICD-10-CM | POA: Diagnosis not present

## 2020-12-23 NOTE — ED Provider Notes (Signed)
HPI  SUBJECTIVE:  Sara Jenkins is a right-handed 25 y.o. female who presents for clearance to return to work.  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  She was seen here and diagnosed with a left shoulder strain on 1/23.  She has been taking ibuprofen with improvement in her symptoms.  She has not yet tried the Zanaflex.  She states that she is overall getting much better.  No numbness or tingling, no grip weakness.  Past medical history: None.  No past medical history on file.  Past Surgical History:  Procedure Laterality Date  . ADENOIDECTOMY  06/2018    Family History  Family history unknown: Yes    Social History   Tobacco Use  . Smoking status: Former Smoker    Years: 5.00  . Smokeless tobacco: Never Used  Vaping Use  . Vaping Use: Former  Substance Use Topics  . Alcohol use: Yes    Comment: Occ  . Drug use: No    No current facility-administered medications for this encounter.  Current Outpatient Medications:  .  albuterol (VENTOLIN HFA) 108 (90 Base) MCG/ACT inhaler, Inhale 1-2 puffs into the lungs every 6 (six) hours as needed for wheezing or shortness of breath., Disp: 18 g, Rfl: 0 .  ibuprofen (ADVIL) 800 MG tablet, Take 1 tablet (800 mg total) by mouth 3 (three) times daily., Disp: 21 tablet, Rfl: 0 .  tiZANidine (ZANAFLEX) 4 MG capsule, Take 1 capsule (4 mg total) by mouth 3 (three) times daily for 7 days., Disp: 21 capsule, Rfl: 0  Allergies  Allergen Reactions  . Peanut Oil Anaphylaxis     ROS  As noted in HPI.   Physical Exam  BP 114/80   Pulse 93   Temp 98.6 F (37 C)   Resp 18   LMP  (LMP Unknown)   SpO2 98%   Constitutional: Well developed, well nourished, no acute distress Eyes:  EOMI, conjunctiva normal bilaterally HENT: Normocephalic, atraumatic,mucus membranes moist Respiratory: Normal inspiratory effort Cardiovascular: Normal rate GI: nondistended skin: No rash, skin  intact Musculoskeletal: Left shoulder tenderness along insertion of trapezius.  L Shoulder with ROM normal, Drop test normal , clavicle NT, A/C joint NT,  proximal humerus NT, Motor strength normal. Sensation intact LT over deltoid region, distal NVI with hand having intact sensation and strength in the distribution of the median, radial, and ulnar nerve.   negative tenderness in bicipital groove,  negative empty can test negative liftoff test, Neurologic: Alert & oriented x 3, no focal neuro deficits Psychiatric: Speech and behavior appropriate   ED Course   Medications - No data to display  No orders of the defined types were placed in this encounter.   No results found for this or any previous visit (from the past 24 hour(s)). No results found.  ED Clinical Impression  1. Strain of left shoulder, subsequent encounter      ED Assessment/Plan  Patient with a shoulder strain.  She states that she is getting much better. will have her continue ibuprofen/Tylenol and start the Zanaflex that she has not yet started.   will write note clearing her to return to full duty tomorrow.   Discussed MDM, treatment plan, . patient agrees with plan.   No orders of the defined types were placed in this encounter.   *This clinic note was created using Dragon dictation software. Therefore, there may be occasional mistakes despite careful proofreading.   ?  Domenick Gong, MD 12/24/20 [phone]

## 2020-12-23 NOTE — ED Triage Notes (Signed)
Pt states she needs note to return to work after shoulder injury , job requesting her be evaluated. Was seen on 1/23 for same

## 2020-12-23 NOTE — Discharge Instructions (Addendum)
Continue ibuprofen, but add 1000 mg of Tylenol with it 3 times a day.  Try the Zanaflex.  Ice your shoulder after using it.

## 2021-02-07 DIAGNOSIS — H5213 Myopia, bilateral: Secondary | ICD-10-CM | POA: Diagnosis not present

## 2021-06-24 ENCOUNTER — Ambulatory Visit (INDEPENDENT_AMBULATORY_CARE_PROVIDER_SITE_OTHER): Payer: Medicaid Other | Admitting: Podiatry

## 2021-06-24 DIAGNOSIS — Z5329 Procedure and treatment not carried out because of patient's decision for other reasons: Secondary | ICD-10-CM

## 2021-07-03 ENCOUNTER — Encounter: Payer: Self-pay | Admitting: Emergency Medicine

## 2021-07-03 ENCOUNTER — Other Ambulatory Visit: Payer: Self-pay

## 2021-07-03 ENCOUNTER — Ambulatory Visit
Admission: EM | Admit: 2021-07-03 | Discharge: 2021-07-03 | Disposition: A | Discharge status: Home / Self Care | Payer: Medicaid Other | Attending: Family Medicine | Admitting: Family Medicine

## 2021-07-03 DIAGNOSIS — R2243 Localized swelling, mass and lump, lower limb, bilateral: Secondary | ICD-10-CM | POA: Diagnosis not present

## 2021-07-03 MED ORDER — TRIAMCINOLONE ACETONIDE 0.025 % EX OINT: 1.0000 "application " | TOPICAL_OINTMENT | Freq: Two times a day (BID) | CUTANEOUS | 0 refills | Status: AC

## 2021-07-03 MED ORDER — INDOMETHACIN 25 MG PO CAPS: 25.0000 mg | ORAL_CAPSULE | Freq: Two times a day (BID) | ORAL | 0 refills | Status: AC

## 2021-07-03 NOTE — ED Provider Notes (Signed)
[facility]    CSN: [id number] Arrival date & time: 07/03/21  0950      History   Chief Complaint Chief Complaint  Patient presents with   Rash   Leg Pain    HPI Sara Jenkins is a 25 y.o. female.   HPI Patient presents today with recurrent BLE leg nodules erupting which are painful and itching. No history of autoimmune disease or joint pain.  The skin appears bruised after the nodule resolve. During acute flare up when nodules are enlarged, the  nodules are tender to touch and painful. She has not attempted relief with any medication.    History reviewed. No pertinent past medical history.  There are no problems to display for this patient.   Past Surgical History:  Procedure Laterality Date   ADENOIDECTOMY  06/2018    OB History     Gravida  5   Para  4   Term      Preterm      AB      Living         SAB      IAB      Ectopic      Multiple      Live Births               Home Medications    Prior to Admission medications   Medication Sig Start Date End Date Taking? Authorizing Provider  albuterol (VENTOLIN HFA) 108 (90 Base) MCG/ACT inhaler Inhale 1-2 puffs into the lungs every 6 (six) hours as needed for wheezing or shortness of breath. 12/10/20   Avegno, Zachery Dakins, FNP  ibuprofen (ADVIL) 800 MG tablet Take 1 tablet (800 mg total) by mouth 3 (three) times daily. 12/19/20   Rhys Martini, PA-C  cetirizine (ZYRTEC ALLERGY) 10 MG tablet Take 1 tablet (10 mg total) by mouth daily. 12/10/20 12/19/20  Avegno, Zachery Dakins, FNP  fluticasone (FLONASE) 50 MCG/ACT nasal spray Place 1 spray into both nostrils daily. 02/16/19 12/19/20  Petrucelli, Pleas Koch, PA-C    Family History Family History  Family history unknown: Yes    Social History Social History   Tobacco Use   Smoking status: Former    Years: 5.00    Types: Cigarettes   Smokeless tobacco: Never  Vaping Use   Vaping Use: Former  Substance Use Topics   Alcohol use: Yes     Comment: Occ   Drug use: No     Allergies   Peanut oil   Review of Systems Review of Systems Pertinent negatives listed in HPI   Physical Exam Triage Vital Signs ED Triage Vitals [07/03/21 1000]  Enc Vitals Group     BP 129/88     Pulse Rate 88     Resp 20     Temp 98.4 F (36.9 C)     Temp Source Oral     SpO2 100 %     Weight      Height      Head Circumference      Peak Flow      Pain Score 5     Pain Loc      Pain Edu?      Excl. in GC?    No data found.  Updated Vital Signs BP 129/88   Pulse 88   Temp 98.4 F (36.9 C) (Oral)   Resp 20   SpO2 100%   Visual Acuity Right Eye Distance:   Left Eye Distance:  Bilateral Distance:    Right Eye Near:   Left Eye Near:    Bilateral Near:     Physical Exam General appearance: Alert, well developed, well nourished, cooperative  Head: Normocephalic, without obvious abnormality, atraumatic Respiratory: Respirations even and unlabored, normal respiratory rate Heart: rate and rhythm normal. No gallop or murmurs noted on exam  Abdomen: BS +, no distention, no rebound tenderness, or no mass Extremities: No gross deformities Skin: Skin color bruising BLE diffuse location with subcutaneous induration, texture, turgor normal. No rashes seen  Psych: Appropriate mood and affect. Neurologic: GCS 15, normal coordination, normal gait  UC Treatments / Results  Labs (all labs ordered are listed, but only abnormal results are displayed) Labs Reviewed - No data to display  EKG   Radiology No results found.  Procedures Procedures (including critical care time)  Medications Ordered in UC Medications - No data to display  Initial Impression / Assessment and Plan / UC Course  I have reviewed the triage vital signs and the nursing notes.  Pertinent labs & imaging results that were available during my care of the patient were reviewed by me and considered in my medical decision making (see chart for  details).    Trial course anti-inflammatories twice daily x 10 days. Suspect inflammatory etiology given swelling and bruising is intermittent and resolves and reflares Established a primary care appointment for patient as she is overdue for a CPE. Return precautions given.   Final Clinical Impressions(s) / UC Diagnoses   Final diagnoses:  Nodule of skin of both lower legs   Discharge Instructions   None    ED Prescriptions     Medication Sig Dispense Auth. Provider   indomethacin (INDOCIN) 25 MG capsule Take 1 capsule (25 mg total) by mouth 2 (two) times daily with a meal. 30 capsule Bing Neighbors, FNP   triamcinolone (KENALOG) 0.025 % ointment Apply 1 application topically 2 (two) times daily. 160 g Bing Neighbors, FNP      PDMP not reviewed this encounter.   Bing Neighbors, [place] 07/10/21 [phone]

## 2021-07-03 NOTE — ED Triage Notes (Signed)
Pt here with large painful lumps on both legs that come and go for over 2 months. When they first start out they are red and painful and then fade to tender bruises. At times they do itch, but are mostly just painful to touch.

## 2021-08-03 ENCOUNTER — Ambulatory Visit: Payer: Medicaid Other | Admitting: Registered Nurse

## 2021-12-02 ENCOUNTER — Ambulatory Visit: Payer: Medicaid Other | Admitting: Podiatry

## 2022-12-25 DIAGNOSIS — R220 Localized swelling, mass and lump, head: Secondary | ICD-10-CM | POA: Diagnosis not present

## 2022-12-25 DIAGNOSIS — K047 Periapical abscess without sinus: Secondary | ICD-10-CM | POA: Diagnosis not present

## 2022-12-25 DIAGNOSIS — K0889 Other specified disorders of teeth and supporting structures: Secondary | ICD-10-CM | POA: Diagnosis not present

## 2022-12-26 DIAGNOSIS — K0889 Other specified disorders of teeth and supporting structures: Secondary | ICD-10-CM | POA: Diagnosis not present

## 2023-09-07 DIAGNOSIS — N921 Excessive and frequent menstruation with irregular cycle: Secondary | ICD-10-CM | POA: Diagnosis not present

## 2023-09-07 DIAGNOSIS — Z30011 Encounter for initial prescription of contraceptive pills: Secondary | ICD-10-CM | POA: Diagnosis not present

## 2023-09-07 DIAGNOSIS — Z975 Presence of (intrauterine) contraceptive device: Secondary | ICD-10-CM | POA: Diagnosis not present

## 2024-01-07 ENCOUNTER — Telehealth: Payer: Medicaid Other | Admitting: Physician Assistant

## 2024-01-07 DIAGNOSIS — J020 Streptococcal pharyngitis: Secondary | ICD-10-CM

## 2024-01-07 MED ORDER — AMOXICILLIN 500 MG PO CAPS: 500.0000 mg | ORAL_CAPSULE | Freq: Two times a day (BID) | ORAL | 0 refills | Status: AC

## 2024-01-07 NOTE — Progress Notes (Signed)
 Virtual Visit Consent   Sara Jenkins, you are scheduled for a virtual visit with a [facility] provider today. Just as with appointments in the office, your consent must be obtained to participate. Your consent will be active for this visit and any virtual visit you may have with one of our providers in the next 365 days. If you have a MyChart account, a copy of this consent can be sent to you electronically.  As this is a virtual visit, video technology does not allow for your provider to perform a traditional examination. This may limit your provider's ability to fully assess your condition. If your provider identifies any concerns that need to be evaluated in person or the need to arrange testing (such as labs, EKG, etc.), we will make arrangements to do so. Although advances in technology are sophisticated, we cannot ensure that it will always work on either your end or our end. If the connection with a video visit is poor, the visit may have to be switched to a telephone visit. With either a video or telephone visit, we are not always able to ensure that we have a secure connection.  By engaging in this virtual visit, you consent to the provision of healthcare and authorize for your insurance to be billed (if applicable) for the services provided during this visit. Depending on your insurance coverage, you may receive a charge related to this service.  I need to obtain your verbal consent now. Are you willing to proceed with your visit today? Sara Jenkins has provided verbal consent on 01/07/2024 for a virtual visit (video or telephone). Angelia Kelp, PA-C  Date: 01/07/2024 5:10 PM   Virtual Visit via Video Note   I, Angelia Kelp, connected with  Sara Jenkins  ([id number], Apr 06, 1996) on 01/07/24 at  5:00 PM EST by a video-enabled telemedicine application and verified that I am speaking with the correct person using two identifiers.  Location: Patient: Virtual Visit Location Patient:  Home Provider: Virtual Visit Location Provider: Home Office   I discussed the limitations of evaluation and management by telemedicine and the availability of in person appointments. The patient expressed understanding and agreed to proceed.    History of Present Illness: Sara Jenkins is a 28 y.o. who identifies as a female who was assigned female at birth, and is being seen today for sore throat.  HPI: Sore Throat  This is a new problem. The current episode started today (overnight). The problem has been gradually worsening. There has been no fever. The pain is moderate. Associated symptoms include ear pain (mild this morning and improving), swollen glands and trouble swallowing. Pertinent negatives include no congestion, coughing, diarrhea, drooling, ear discharge, headaches, hoarse voice, plugged ear sensation or shortness of breath. She has had no exposure to strep or mono. Treatments tried: dayquil. The treatment provided no relief.     Problems: There are no active problems to display for this patient.   Allergies:  Allergies  Allergen Reactions   Peanut Oil Anaphylaxis   Medications:  Current Outpatient Medications:    amoxicillin  (AMOXIL ) 500 MG capsule, Take 1 capsule (500 mg total) by mouth 2 (two) times daily for 10 days., Disp: 20 capsule, Rfl: 0   albuterol  (VENTOLIN  HFA) 108 (90 Base) MCG/ACT inhaler, Inhale 1-2 puffs into the lungs every 6 (six) hours as needed for wheezing or shortness of breath., Disp: 18 g, Rfl: 0   ibuprofen  (ADVIL ) 800 MG tablet, Take 1 tablet (800 mg total) by  mouth 3 (three) times daily., Disp: 21 tablet, Rfl: 0   indomethacin  (INDOCIN ) 25 MG capsule, Take 1 capsule (25 mg total) by mouth 2 (two) times daily with a meal., Disp: 30 capsule, Rfl: 0   triamcinolone  (KENALOG ) 0.025 % ointment, Apply 1 application topically 2 (two) times daily., Disp: 160 g, Rfl: 0  Observations/Objective: Patient is well-developed, well-nourished in no acute distress.   Resting comfortably at home.  Head is normocephalic, atraumatic.  No labored breathing.  Speech is clear and coherent with logical content.  Patient is alert and oriented at baseline.    Assessment and Plan: 1. Strep pharyngitis (Primary) - amoxicillin  (AMOXIL ) 500 MG capsule; Take 1 capsule (500 mg total) by mouth 2 (two) times daily for 10 days.  Dispense: 20 capsule; Refill: 0  - Suspect strep throat - Amoxicillin  prescribed - Tylenol  and Ibuprofen  alternating every 4 hours - Salt water gargles - Chloraseptic spray - Liquid and soft food diet - Push fluids - New toothbrush in 3 days - Seek in person evaluation if not improving or if symptoms worsen   Follow Up Instructions: I discussed the assessment and treatment plan with the patient. The patient was provided an opportunity to ask questions and all were answered. The patient agreed with the plan and demonstrated an understanding of the instructions.  A copy of instructions were sent to the patient via MyChart unless otherwise noted below.    The patient was advised to call back or seek an in-person evaluation if the symptoms worsen or if the condition fails to improve as anticipated.    Angelia Kelp, PA-C

## 2024-01-07 NOTE — Patient Instructions (Signed)
 Sara Jenkins, thank you for joining Angelia Kelp, PA-C for today's virtual visit.  While this provider is not your primary care provider (PCP), if your PCP is located in our provider database this encounter information will be shared with them immediately following your visit.   A [facility] MyChart account gives you access to today's visit and all your visits, tests, and labs performed at [facility] " click here if you don't have a  MyChart account or go to [email or web address]  Consent: (Patient) Sara Jenkins provided verbal consent for this virtual visit at the beginning of the encounter.  Current Medications:  Current Outpatient Medications:    amoxicillin  (AMOXIL ) 500 MG capsule, Take 1 capsule (500 mg total) by mouth 2 (two) times daily for 10 days., Disp: 20 capsule, Rfl: 0   albuterol  (VENTOLIN  HFA) 108 (90 Base) MCG/ACT inhaler, Inhale 1-2 puffs into the lungs every 6 (six) hours as needed for wheezing or shortness of breath., Disp: 18 g, Rfl: 0   ibuprofen  (ADVIL ) 800 MG tablet, Take 1 tablet (800 mg total) by mouth 3 (three) times daily., Disp: 21 tablet, Rfl: 0   indomethacin  (INDOCIN ) 25 MG capsule, Take 1 capsule (25 mg total) by mouth 2 (two) times daily with a meal., Disp: 30 capsule, Rfl: 0   triamcinolone  (KENALOG ) 0.025 % ointment, Apply 1 application topically 2 (two) times daily., Disp: 160 g, Rfl: 0   Medications ordered in this encounter:  Meds ordered this encounter  Medications   amoxicillin  (AMOXIL ) 500 MG capsule    Sig: Take 1 capsule (500 mg total) by mouth 2 (two) times daily for 10 days.    Dispense:  20 capsule    Refill:  0    Supervising Provider:   Corine Dice [[id number]]     *If you need refills on other medications prior to your next appointment, please contact your pharmacy*  Follow-Up: Call back or seek an in-person evaluation if the symptoms worsen or if the condition fails to improve as anticipated.  [facility] [phone]  Other Instructions Strep Throat, Adult Strep throat is an infection in the throat that is caused by bacteria. It is common during the cold months of the year. It mostly affects children who are 6-43 years old. However, people of all ages can get it at any time of the year. This infection spreads from person to person (is contagious) through coughing, sneezing, or having close contact. Your health care provider may use other names to describe the infection. When strep throat affects the tonsils, it is called tonsillitis. When it affects the back of the throat, it is called pharyngitis. What are the causes? This condition is caused by the Streptococcus pyogenes bacteria. What increases the risk? You are more likely to develop this condition if: You care for school-age children, or are around school-age children. Children are more likely to get strep throat and may spread it to others. You spend time in crowded places where the infection can spread easily. You have close contact with someone who has strep throat. What are the signs or symptoms? Symptoms of this condition include: Fever or chills. Redness, swelling, or pain in the tonsils or throat. Pain or difficulty when swallowing. White or yellow spots on the tonsils or throat. Tender glands in the neck and under the jaw. Bad smelling breath. Red rash all over the body. This is rare. How is this diagnosed? This condition is diagnosed by tests that  check for the presence and the amount of bacteria that cause strep throat. They are: Rapid strep test. Your throat is swabbed and checked for the presence of bacteria. Results are usually ready in minutes. Throat culture test. Your throat is swabbed. The sample is placed in a cup that allows infections to grow. Results are usually ready in 1 or 2 days. How is this treated? This condition may be treated with: Medicines that kill germs (antibiotics). Medicines  that relieve pain or fever. These include: Ibuprofen  or acetaminophen . Aspirin, only for people who are over the age of 52. Throat lozenges. Throat sprays. Follow these instructions at home: Medicines  Take over-the-counter and prescription medicines only as told by your health care provider. Take your antibiotic medicine as told by your health care provider. Do not stop taking the antibiotic even if you start to feel better. Eating and drinking  If you have trouble swallowing, try eating soft foods until your sore throat feels better. Drink enough fluid to keep your urine pale yellow. To help relieve pain, you may have: Warm fluids, such as soup and tea. Cold fluids, such as frozen desserts or popsicles. General instructions Gargle with a salt-water mixture 3-4 times a day or as needed. To make a salt-water mixture, completely dissolve -1 tsp (3-6 g) of salt in 1 cup (237 mL) of warm water. Get plenty of rest. Stay home from work or school until you have been taking antibiotics for 24 hours. Do not use any products that contain nicotine or tobacco. These products include cigarettes, chewing tobacco, and vaping devices, such as e-cigarettes. If you need help quitting, ask your health care provider. It is up to you to get your test results. Ask your health care provider, or the department that is doing the test, when your results will be ready. Keep all follow-up visits. This is important. How is this prevented?  Do not share food, drinking cups, or personal items that could cause the infection to spread to other people. Wash your hands often with soap and water for at least 20 seconds. If soap and water are not available, use hand sanitizer. Make sure that all people in your house wash their hands well. Have family members tested if they have a sore throat or fever. They may need an antibiotic if they have strep throat. Contact a health care provider if: You have swelling in your neck  that keeps getting bigger. You develop a rash, cough, or earache. You cough up a thick mucus that is green, yellow-brown, or bloody. You have pain or discomfort that does not get better with medicine. Your symptoms seem to be getting worse. You have a fever. Get help right away if: You have new symptoms, such as vomiting, severe headache, stiff or painful neck, chest pain, or shortness of breath. You have severe throat pain, drooling, or changes in your voice. You have swelling of the neck, or the skin on the neck becomes red and tender. You have signs of dehydration, such as tiredness (fatigue), dry mouth, and decreased urination. You become increasingly sleepy, or you cannot wake up completely. Your joints become red or painful. These symptoms may represent a serious problem that is an emergency. Do not wait to see if the symptoms will go away. Get medical help right away. Call your local emergency services (911 in the [place] Do not drive yourself to the hospital. Summary Strep throat is an infection in the throat that is caused by the Streptococcus  pyogenes bacteria. This infection is spread from person to person (is contagious) through coughing, sneezing, or having close contact. Take your medicines, including antibiotics, as told by your health care provider. Do not stop taking the antibiotic even if you start to feel better. To prevent the spread of germs, wash your hands well with soap and water. Have others do the same. Do not share food, drinking cups, or personal items. Get help right away if you have new symptoms, such as vomiting, severe headache, stiff or painful neck, chest pain, or shortness of breath. This information is not intended to replace advice given to you by your health care provider. Make sure you discuss any questions you have with your health care provider. Document Revised: 03/08/2021 Document Reviewed: 03/08/2021 Else[employer] Patient Education  2024 [employer]   If you have been instructed to have an in-person evaluation today at a local Urgent Care facility, please use the link below. It will take you to a list of all of our available [facility], including address, phone number and hours of operation. Please do not delay care.  [facility]  If you or a family member do not have a primary care provider, use the link below to schedule a visit and establish care. When you choose a [facility] primary care physician or advanced practice provider, you gain a long-term partner in health. Find a Primary Care Provider  Learn more about [facility]'s in-office and virtual care options: [facility] Get Care Now

## 2024-02-14 DIAGNOSIS — S6991XA Unspecified injury of right wrist, hand and finger(s), initial encounter: Secondary | ICD-10-CM | POA: Diagnosis not present

## 2024-02-14 DIAGNOSIS — S63501A Unspecified sprain of right wrist, initial encounter: Secondary | ICD-10-CM | POA: Diagnosis not present

## 2024-02-20 DIAGNOSIS — S63501D Unspecified sprain of right wrist, subsequent encounter: Secondary | ICD-10-CM | POA: Diagnosis not present

## 2025-01-13 ENCOUNTER — Ambulatory Visit: Admitting: Clinical

## 2025-01-19 ENCOUNTER — Ambulatory Visit: Admitting: Nurse Practitioner

## 2025-01-19 ENCOUNTER — Encounter: Admitting: Obstetrics and Gynecology
